# Patient Record
Sex: Female | Born: 1980
Health system: Southern US, Community
[De-identification: ages and names within clinical notes are randomized; demographics above are authoritative.]

## PROBLEM LIST (undated history)

## (undated) DIAGNOSIS — D259 Leiomyoma of uterus, unspecified: Secondary | ICD-10-CM

## (undated) DIAGNOSIS — Z9889 Other specified postprocedural states: Secondary | ICD-10-CM

## (undated) DIAGNOSIS — N979 Female infertility, unspecified: Secondary | ICD-10-CM

## (undated) DIAGNOSIS — E0590 Thyrotoxicosis, unspecified without thyrotoxic crisis or storm: Secondary | ICD-10-CM

## (undated) DIAGNOSIS — R112 Nausea with vomiting, unspecified: Secondary | ICD-10-CM

## (undated) DIAGNOSIS — I10 Essential (primary) hypertension: Secondary | ICD-10-CM

## (undated) DIAGNOSIS — IMO0001 Reserved for inherently not codable concepts without codable children: Secondary | ICD-10-CM

---
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
## 2011-05-03 ENCOUNTER — Other Ambulatory Visit: Payer: Self-pay | Admitting: Obstetrics and Gynecology

## 2011-05-03 DIAGNOSIS — E010 Iodine-deficiency related diffuse (endemic) goiter: Secondary | ICD-10-CM

## 2011-05-05 ENCOUNTER — Ambulatory Visit
Admission: RE | Admit: 2011-05-05 | Discharge: 2011-05-05 | Disposition: A | Discharge status: Home / Self Care | Payer: BC Managed Care – PPO | Source: Ambulatory Visit | Attending: Obstetrics and Gynecology | Admitting: Obstetrics and Gynecology

## 2011-05-05 DIAGNOSIS — E010 Iodine-deficiency related diffuse (endemic) goiter: Secondary | ICD-10-CM

## 2012-04-12 ENCOUNTER — Other Ambulatory Visit (HOSPITAL_COMMUNITY)
Admission: RE | Admit: 2012-04-12 | Discharge: 2012-04-12 | Disposition: A | Discharge status: Home / Self Care | Payer: 59 | Source: Ambulatory Visit | Attending: Obstetrics & Gynecology | Admitting: Obstetrics & Gynecology

## 2012-04-12 DIAGNOSIS — Z113 Encounter for screening for infections with a predominantly sexual mode of transmission: Secondary | ICD-10-CM | POA: Insufficient documentation

## 2012-04-12 DIAGNOSIS — Z124 Encounter for screening for malignant neoplasm of cervix: Secondary | ICD-10-CM | POA: Insufficient documentation

## 2012-04-12 DIAGNOSIS — Z1151 Encounter for screening for human papillomavirus (HPV): Secondary | ICD-10-CM | POA: Insufficient documentation

## 2012-05-03 ENCOUNTER — Ambulatory Visit: Payer: Self-pay | Admitting: Obstetrics and Gynecology

## 2012-09-25 HISTORY — PX: DIAGNOSTIC LAPAROSCOPY: SUR761

## 2012-12-26 ENCOUNTER — Ambulatory Visit (INDEPENDENT_AMBULATORY_CARE_PROVIDER_SITE_OTHER): Payer: 59 | Admitting: Women's Health

## 2012-12-26 ENCOUNTER — Encounter: Payer: Self-pay | Admitting: Women's Health

## 2012-12-26 VITALS — BP 136/92 | Ht 67.0 in | Wt 158.0 lb

## 2012-12-26 DIAGNOSIS — Z01419 Encounter for gynecological examination (general) (routine) without abnormal findings: Secondary | ICD-10-CM

## 2012-12-26 DIAGNOSIS — N979 Female infertility, unspecified: Secondary | ICD-10-CM

## 2012-12-26 DIAGNOSIS — N926 Irregular menstruation, unspecified: Secondary | ICD-10-CM

## 2012-12-26 DIAGNOSIS — Z833 Family history of diabetes mellitus: Secondary | ICD-10-CM

## 2012-12-26 LAB — CBC WITH DIFFERENTIAL/PLATELET
Basophils Absolute: 0 10*3/uL (ref 0.0–0.1)
Basophils Relative: 1 % (ref 0–1)
Eosinophils Absolute: 0.1 10*3/uL (ref 0.0–0.7)
Eosinophils Relative: 1 % (ref 0–5)
HCT: 40.9 % (ref 36.0–46.0)
Hemoglobin: 14 g/dL (ref 12.0–15.0)
Lymphocytes Relative: 39 % (ref 12–46)
Lymphs Abs: 1.7 10*3/uL (ref 0.7–4.0)
MCH: 32.6 pg (ref 26.0–34.0)
MCHC: 34.2 g/dL (ref 30.0–36.0)
MCV: 95.1 fL (ref 78.0–100.0)
Monocytes Absolute: 0.4 10*3/uL (ref 0.1–1.0)
Monocytes Relative: 8 % (ref 3–12)
Neutro Abs: 2.2 10*3/uL (ref 1.7–7.7)
Neutrophils Relative %: 51 % (ref 43–77)
Platelets: 251 10*3/uL (ref 150–400)
RBC: 4.3 MIL/uL (ref 3.87–5.11)
RDW: 13.9 % (ref 11.5–15.5)
WBC: 4.4 10*3/uL (ref 4.0–10.5)

## 2012-12-26 LAB — TSH: TSH: 0.549 u[IU]/mL (ref 0.350–4.500)

## 2012-12-26 LAB — GLUCOSE, RANDOM: Glucose, Bld: 94 mg/dL (ref 70–99)

## 2012-12-26 LAB — PROLACTIN: Prolactin: 10.5 ng/mL

## 2012-12-26 NOTE — Progress Notes (Signed)
[name] 07-20-1981 [id number]    History:    New patient presents for annual exam and problem. No contraception for 5 years without conception. States has positive ovulation with home ovulation predictor kits. History of positive Chlamydia 7 years ago treated. Cycles every 27-31 days for 5 d. Normal Pap history, last Pap June 2013. States husband is healthy. G2 TAB 1, SAB 1.  Past medical history, past surgical history, family history and social history were all reviewed and documented in the EPIC chart. 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 Graduated from [employer] in [place].  Numerous family members with hypertension.   ROS:  A  ROS was performed and pertinent positives and negatives are included in the history.  Exam:  Filed Vitals:   12/26/12 0807  BP: 136/92    General appearance:  Normal Head/Neck:  Normal, without cervical or supraclavicular adenopathy. Thyroid:  Symmetrical, normal in size, without palpable masses or nodularity. Respiratory  Effort:  Normal  Auscultation:  Clear without wheezing or rhonchi Cardiovascular  Auscultation:  Regular rate, without rubs, murmurs or gallops  Edema/varicosities:  Not grossly evident Abdominal  Soft,nontender, without masses, guarding or rebound.  Liver/spleen:  No organomegaly noted  Hernia:  None appreciated  Skin  Inspection:  Grossly normal  Palpation:  Grossly normal Neurologic/psychiatric  Orientation:  Normal with appropriate conversation.  Mood/affect:  Normal  Genitourinary    Breasts: Examined lying and sitting.     Right: Without masses, retractions, discharge or axillary adenopathy.     Left: Without masses, retractions, discharge or axillary adenopathy.   Inguinal/mons:  Normal without inguinal adenopathy  External genitalia:  Normal  BUS/Urethra/Skene's glands:  Normal  Bladder:  Normal  Vagina:  Normal  Cervix:  Normal  Uterus:  normal in size, shape and contour.  Midline and  mobile  Adnexa/parametria:     Rt: Without masses or tenderness.   Lt: Without masses or tenderness.  Anus and perineum: Normal  Digital rectal exam: Normal sphincter tone without palpated masses or tenderness  Assessment/Plan:  32 y.o. MBF G2P0 for annual exam.     Secondary infertility Borderline hypertension  Plan: Reviewed blood pressure reading today will keep a record and followup with primary care if continues greater than 130/80. SBE's, exercise, calcium rich diet, MVI daily encouraged. CBC, TSH, prolactin, UA, Pap normal 02/2012, new screening guidelines reviewed. Semen analysis, will schedule, estradiol, FSH day 3 of next cycle and progesterone day 22 -25. Hysterosalpingogram reviewed, need for antibiotic day before, day of, day after reviewed. If all tests are negative/normal will schedule appointment with Dr. Audie Box to discuss plan.    Harrington Challenger WHNP, 1:04 PM 12/26/2012

## 2012-12-26 NOTE — Patient Instructions (Signed)

## 2012-12-27 LAB — URINALYSIS W MICROSCOPIC + REFLEX CULTURE
Bilirubin Urine: NEGATIVE
Casts: NONE SEEN
Crystals: NONE SEEN
Glucose, UA: NEGATIVE mg/dL
Hgb urine dipstick: NEGATIVE
Ketones, ur: NEGATIVE mg/dL
Leukocytes, UA: NEGATIVE
Nitrite: NEGATIVE
Protein, ur: NEGATIVE mg/dL
Specific Gravity, Urine: 1.024 (ref 1.005–1.030)
Urobilinogen, UA: 0.2 mg/dL (ref 0.0–1.0)
pH: 5.5 (ref 5.0–8.0)

## 2012-12-30 LAB — URINE CULTURE: Colony Count: 15000

## 2013-01-15 ENCOUNTER — Other Ambulatory Visit: Payer: Self-pay | Admitting: *Deleted

## 2013-01-15 DIAGNOSIS — N979 Female infertility, unspecified: Secondary | ICD-10-CM

## 2013-01-16 ENCOUNTER — Other Ambulatory Visit: Payer: 59

## 2013-01-16 DIAGNOSIS — N979 Female infertility, unspecified: Secondary | ICD-10-CM

## 2013-01-16 LAB — ESTRADIOL: Estradiol: 34.1 pg/mL

## 2013-01-16 LAB — PROGESTERONE: Progesterone: 0.4 ng/mL

## 2013-01-16 LAB — FOLLICLE STIMULATING HORMONE: FSH: 5.2 m[IU]/mL

## 2013-01-21 ENCOUNTER — Other Ambulatory Visit: Payer: Self-pay | Admitting: Women's Health

## 2013-01-21 DIAGNOSIS — N979 Female infertility, unspecified: Secondary | ICD-10-CM

## 2013-02-05 ENCOUNTER — Other Ambulatory Visit: Payer: 59

## 2013-02-05 DIAGNOSIS — N979 Female infertility, unspecified: Secondary | ICD-10-CM

## 2013-02-05 LAB — PROGESTERONE: Progesterone: 5.6 ng/mL

## 2013-02-26 ENCOUNTER — Encounter: Payer: Self-pay | Admitting: Women's Health

## 2013-04-17 ENCOUNTER — Telehealth: Payer: Self-pay

## 2013-04-17 NOTE — Telephone Encounter (Signed)
Patient called and left message asking [place] to call her regarding "what we are working on" and is ready for "next step".  I called her back and reached her voice mail.  I left message that it looked like she had done everything [place] recommended at last ov with the exception of HSG.  I told her that is scheduled with cycle. To call Day one and it will be scheduled D 7, 8, 9, 10 at [facility].  I told her after that is completed she had recommended patient schedule appt with Dr. Velvet Bathe to discuss further plans.  I asked patient to call me back if any questions about this.

## 2013-05-02 ENCOUNTER — Telehealth: Payer: Self-pay | Admitting: *Deleted

## 2013-05-02 DIAGNOSIS — N979 Female infertility, unspecified: Secondary | ICD-10-CM

## 2013-05-02 MED ORDER — DOXYCYCLINE HYCLATE 100 MG PO CAPS: ORAL_CAPSULE | ORAL | Status: DC

## 2013-05-02 NOTE — Telephone Encounter (Signed)
Left message for to call to confirm this was 1st day of cycle.

## 2013-05-02 NOTE — Telephone Encounter (Signed)
[facility]

## 2013-05-02 NOTE — Telephone Encounter (Signed)
Pt informed with the below note, rx sent. 

## 2013-05-02 NOTE — Telephone Encounter (Signed)
Yes please, will need doxycycline 100 twice a day day before, day of a day after HSG  #6.

## 2013-05-02 NOTE — Telephone Encounter (Signed)
Pt called this am requesting appointment for HSG? Did you want me to schedule this for pt? Please advise

## 2013-05-08 ENCOUNTER — Ambulatory Visit (HOSPITAL_COMMUNITY)
Admission: RE | Admit: 2013-05-08 | Discharge: 2013-05-08 | Disposition: A | Discharge status: Home / Self Care | Payer: 59 | Source: Ambulatory Visit | Attending: Women's Health | Admitting: Women's Health

## 2013-05-08 DIAGNOSIS — N979 Female infertility, unspecified: Secondary | ICD-10-CM | POA: Insufficient documentation

## 2013-05-08 MED ORDER — IOHEXOL 300 MG/ML  SOLN
20.0000 mL | Freq: Once | INTRAMUSCULAR | Status: AC | PRN
  Administered 2013-05-08: 10 mL

## 2013-05-15 ENCOUNTER — Ambulatory Visit (INDEPENDENT_AMBULATORY_CARE_PROVIDER_SITE_OTHER): Payer: 59 | Admitting: Gynecology

## 2013-05-15 ENCOUNTER — Encounter: Payer: Self-pay | Admitting: Gynecology

## 2013-05-15 DIAGNOSIS — N7011 Chronic salpingitis: Secondary | ICD-10-CM

## 2013-05-15 DIAGNOSIS — N7013 Chronic salpingitis and oophoritis: Secondary | ICD-10-CM

## 2013-05-15 NOTE — Patient Instructions (Signed)
Followup with reproductive endocrinologist for consideration of in vitro fertilization.

## 2013-05-15 NOTE — Progress Notes (Signed)
Patient presents with history of secondary infertility. Had been attempting pregnancy for 5 years without success. Does have history of positive chlamydia 7 years ago. Baseline studies by Harriett Sine shows normal FSH/estradiol, progesterone of 5, semen analysis with 1% normal forms, 90% head defects, normal numbers and motility and an HSG that shows left hydrosalpinx and probable right hydrosalpinx with some question of spill but clear loculation of dye and no free intraperitoneal spill.  I reviewed all these findings with her and possibilities for fertility treatment. Possible laparoscopy with assessment of the pelvis and attempted salpingostomy versus in vitro fertilization discussed. Limitations of the laparoscopic/salpingostomy approach and success rates versus in vitro success discussed My recommendation would be to pursue in vitro fertilization which would address the tubal factor as well as the semen issue which shows a significant decrease of normal forms. Possible ICSI discussed. Names of various programs in the area were given. Patient's going to make her decision and followup as she decides.

## 2013-05-21 ENCOUNTER — Telehealth: Payer: Self-pay

## 2013-05-21 NOTE — Telephone Encounter (Signed)
She needs to contact IVF program in the area that she chooses. Options are [facility] and they have an office at [facility] where they see patients that they do their IVF work in [place]. [facility] and [facility] also have programs.

## 2013-05-21 NOTE — Telephone Encounter (Signed)
Patient informed. 

## 2013-05-21 NOTE — Telephone Encounter (Signed)
Patient said that she and husband had decision to make after last office visit and she wanted me to let you know they have decided they want to proceed with IVF treatment.   She said she needs someone to call her back with info. Sherrilyn Rist?

## 2013-07-02 ENCOUNTER — Telehealth: Payer: Self-pay | Admitting: *Deleted

## 2013-07-02 NOTE — Telephone Encounter (Signed)
Pt informed with the below. 

## 2013-07-02 NOTE — Telephone Encounter (Signed)
Please call and review we will check a Pap smear at annual exam on 10/15. It does not look like we have done a Pap since we have been on EMR

## 2013-07-02 NOTE — Telephone Encounter (Signed)
Pt said that her infertility MD will need a recent pap smear on pt? Pt has schedule appointment on 07/09/13. This okay?

## 2013-07-09 ENCOUNTER — Other Ambulatory Visit (HOSPITAL_COMMUNITY)
Admission: RE | Admit: 2013-07-09 | Discharge: 2013-07-09 | Disposition: A | Discharge status: Home / Self Care | Payer: 59 | Source: Ambulatory Visit | Attending: Gynecology | Admitting: Gynecology

## 2013-07-09 ENCOUNTER — Encounter: Payer: Self-pay | Admitting: Women's Health

## 2013-07-09 ENCOUNTER — Ambulatory Visit (INDEPENDENT_AMBULATORY_CARE_PROVIDER_SITE_OTHER): Payer: 59 | Admitting: Women's Health

## 2013-07-09 DIAGNOSIS — Z01419 Encounter for gynecological examination (general) (routine) without abnormal findings: Secondary | ICD-10-CM | POA: Insufficient documentation

## 2013-07-09 DIAGNOSIS — Z124 Encounter for screening for malignant neoplasm of cervix: Secondary | ICD-10-CM

## 2013-07-09 NOTE — Progress Notes (Signed)
Patient ID: [name], female   DOB: 1980/11/09, 32 y.o.   MRN: [id number] Presents for a Pap. Being evaluated and treated by Dr. Elesa Hacker for infertility. Bilateral blocked fallopian tubes and low sperm count, IVF goal.  Exam: Appears well, external genitalia within normal limits, speculum exam cervix healthy without visible lesion Pap taken.  Instructed to printt Pap results and take to next appointment with Dr. Elesa Hacker.

## 2013-07-31 ENCOUNTER — Other Ambulatory Visit: Payer: Self-pay

## 2014-05-30 ENCOUNTER — Inpatient Hospital Stay (HOSPITAL_COMMUNITY): Admission: AD | Admit: 2014-05-30 | Payer: 59 | Source: Ambulatory Visit | Admitting: Obstetrics and Gynecology

## 2014-07-06 ENCOUNTER — Encounter (HOSPITAL_BASED_OUTPATIENT_CLINIC_OR_DEPARTMENT_OTHER): Payer: Self-pay | Admitting: *Deleted

## 2014-07-06 NOTE — Progress Notes (Signed)
To [facility] at 1030 Hg,urine pregnancy on arrival-orders pending-Npo after Mn.

## 2014-07-07 NOTE — Anesthesia Preprocedure Evaluation (Addendum)
Anesthesia Evaluation  Patient identified by MRN, date of birth, ID band Patient awake    Reviewed: Allergy & Precautions, H&P , NPO status , Patient's Chart, lab work & pertinent test results  History of Anesthesia Complications Negative for: history of anesthetic complications  Airway Mallampati: II TM Distance: >3 FB Neck ROM: Full    Dental no notable dental hx. (+) Teeth Intact, Dental Advisory Given   Pulmonary neg pulmonary ROS,  breath sounds clear to auscultation  Pulmonary exam normal       Cardiovascular Exercise Tolerance: Good negative cardio ROS  Rhythm:Regular Rate:Normal     Neuro/Psych negative neurological ROS  negative psych ROS   GI/Hepatic negative GI ROS, Neg liver ROS,   Endo/Other  negative endocrine ROS  Renal/GU negative Renal ROS  negative genitourinary   Musculoskeletal negative musculoskeletal ROS (+)   Abdominal   Peds negative pediatric ROS (+)  Hematology negative hematology ROS (+)   Anesthesia Other Findings   Reproductive/Obstetrics negative OB ROS                          Anesthesia Physical Anesthesia Plan  ASA: I  Anesthesia Plan: General   Post-op Pain Management:    Induction: Intravenous  Airway Management Planned: Oral ETT  Additional Equipment:   Intra-op Plan:   Post-operative Plan: Extubation in OR  Informed Consent: I have reviewed the patients History and Physical, chart, labs and discussed the procedure including the risks, benefits and alternatives for the proposed anesthesia with the patient or authorized representative who has indicated his/her understanding and acceptance.   Dental advisory given  Plan Discussed with: CRNA  Anesthesia Plan Comments:         Anesthesia Quick Evaluation

## 2014-07-08 ENCOUNTER — Ambulatory Visit (HOSPITAL_BASED_OUTPATIENT_CLINIC_OR_DEPARTMENT_OTHER)
Admission: RE | Admit: 2014-07-08 | Discharge: 2014-07-08 | Disposition: A | Discharge status: Home / Self Care | Payer: 59 | Source: Ambulatory Visit | Attending: Obstetrics and Gynecology | Admitting: Obstetrics and Gynecology

## 2014-07-08 ENCOUNTER — Encounter (HOSPITAL_BASED_OUTPATIENT_CLINIC_OR_DEPARTMENT_OTHER)
Admission: RE | Disposition: A | Discharge status: Home / Self Care | Payer: Self-pay | Source: Ambulatory Visit | Attending: Obstetrics and Gynecology

## 2014-07-08 ENCOUNTER — Encounter (HOSPITAL_BASED_OUTPATIENT_CLINIC_OR_DEPARTMENT_OTHER): Payer: 59 | Admitting: Anesthesiology

## 2014-07-08 ENCOUNTER — Encounter (HOSPITAL_BASED_OUTPATIENT_CLINIC_OR_DEPARTMENT_OTHER): Payer: Self-pay | Admitting: Anesthesiology

## 2014-07-08 ENCOUNTER — Ambulatory Visit (HOSPITAL_BASED_OUTPATIENT_CLINIC_OR_DEPARTMENT_OTHER): Payer: 59 | Admitting: Anesthesiology

## 2014-07-08 DIAGNOSIS — Z87898 Personal history of other specified conditions: Secondary | ICD-10-CM | POA: Insufficient documentation

## 2014-07-08 DIAGNOSIS — N838 Other noninflammatory disorders of ovary, fallopian tube and broad ligament: Secondary | ICD-10-CM | POA: Insufficient documentation

## 2014-07-08 DIAGNOSIS — N7011 Chronic salpingitis: Secondary | ICD-10-CM | POA: Diagnosis present

## 2014-07-08 HISTORY — PX: LAPAROSCOPY: SHX197

## 2014-07-08 LAB — POCT PREGNANCY, URINE: Preg Test, Ur: NEGATIVE

## 2014-07-08 LAB — POCT HEMOGLOBIN-HEMACUE: Hemoglobin: 13.9 g/dL (ref 12.0–15.0)

## 2014-07-08 SURGERY — LAPAROSCOPY OPERATIVE
Anesthesia: General | Site: Abdomen | Laterality: Bilateral

## 2014-07-08 MED ORDER — NEOSTIGMINE METHYLSULFATE 10 MG/10ML IV SOLN
INTRAVENOUS | Status: DC | PRN
  Administered 2014-07-08: 3 mg via INTRAVENOUS

## 2014-07-08 MED ORDER — ACETAMINOPHEN 10 MG/ML IV SOLN
INTRAVENOUS | Status: DC | PRN
  Administered 2014-07-08: 1000 mg via INTRAVENOUS

## 2014-07-08 MED ORDER — GLYCOPYRROLATE 0.2 MG/ML IJ SOLN
INTRAMUSCULAR | Status: DC | PRN
  Administered 2014-07-08: 0.4 mg via INTRAVENOUS

## 2014-07-08 MED ORDER — CEFAZOLIN SODIUM-DEXTROSE 2-3 GM-% IV SOLR
INTRAVENOUS | Status: AC
  Filled 2014-07-08: qty 50

## 2014-07-08 MED ORDER — ROCURONIUM BROMIDE 100 MG/10ML IV SOLN
INTRAVENOUS | Status: DC | PRN
  Administered 2014-07-08: 10 mg via INTRAVENOUS
  Administered 2014-07-08: 25 mg via INTRAVENOUS

## 2014-07-08 MED ORDER — BUPIVACAINE-EPINEPHRINE 0.25% -1:200000 IJ SOLN
INTRAMUSCULAR | Status: DC | PRN
  Administered 2014-07-08: 6 mL

## 2014-07-08 MED ORDER — SODIUM CHLORIDE 0.9 % IR SOLN
Status: DC | PRN
  Administered 2014-07-08: 500 mL

## 2014-07-08 MED ORDER — LACTATED RINGERS IV SOLN
INTRAVENOUS | Status: DC
  Administered 2014-07-08 (×3): via INTRAVENOUS
  Filled 2014-07-08: qty 1000

## 2014-07-08 MED ORDER — METHYLENE BLUE 1 % INJ SOLN
INTRAMUSCULAR | Status: DC | PRN
  Administered 2014-07-08: 1 mL

## 2014-07-08 MED ORDER — LIDOCAINE HCL (CARDIAC) 20 MG/ML IV SOLN
INTRAVENOUS | Status: DC | PRN
  Administered 2014-07-08: 100 mg via INTRAVENOUS

## 2014-07-08 MED ORDER — FENTANYL CITRATE 0.05 MG/ML IJ SOLN
INTRAMUSCULAR | Status: AC
  Filled 2014-07-08: qty 4

## 2014-07-08 MED ORDER — FENTANYL CITRATE 0.05 MG/ML IJ SOLN
INTRAMUSCULAR | Status: DC | PRN
  Administered 2014-07-08: 25 ug via INTRAVENOUS
  Administered 2014-07-08: 100 ug via INTRAVENOUS
  Administered 2014-07-08: 50 ug via INTRAVENOUS

## 2014-07-08 MED ORDER — PROPOFOL INFUSION 10 MG/ML OPTIME
INTRAVENOUS | Status: DC | PRN
  Administered 2014-07-08: 100 mL via INTRAVENOUS
  Administered 2014-07-08: 200 mL via INTRAVENOUS

## 2014-07-08 MED ORDER — ONDANSETRON HCL 4 MG/2ML IJ SOLN
INTRAMUSCULAR | Status: DC | PRN
  Administered 2014-07-08: 4 mg via INTRAVENOUS

## 2014-07-08 MED ORDER — MIDAZOLAM HCL 2 MG/2ML IJ SOLN
INTRAMUSCULAR | Status: AC
  Filled 2014-07-08: qty 2

## 2014-07-08 MED ORDER — CEFAZOLIN SODIUM-DEXTROSE 2-3 GM-% IV SOLR
2.0000 g | INTRAVENOUS | Status: AC
  Administered 2014-07-08: 2 g via INTRAVENOUS
  Filled 2014-07-08: qty 50

## 2014-07-08 MED ORDER — PROMETHAZINE HCL 25 MG/ML IJ SOLN
6.2500 mg | INTRAMUSCULAR | Status: DC | PRN
  Filled 2014-07-08: qty 1

## 2014-07-08 MED ORDER — MIDAZOLAM HCL 5 MG/5ML IJ SOLN
INTRAMUSCULAR | Status: DC | PRN
  Administered 2014-07-08: 2 mg via INTRAVENOUS

## 2014-07-08 MED ORDER — DOXYCYCLINE HYCLATE 50 MG PO CAPS: 100.0000 mg | ORAL_CAPSULE | Freq: Two times a day (BID) | ORAL | Status: DC

## 2014-07-08 MED ORDER — DEXAMETHASONE SODIUM PHOSPHATE 10 MG/ML IJ SOLN
INTRAMUSCULAR | Status: DC | PRN
  Administered 2014-07-08: 10 mg via INTRAVENOUS

## 2014-07-08 MED ORDER — LACTATED RINGERS IR SOLN
Status: DC | PRN
  Administered 2014-07-08: 1000 mL

## 2014-07-08 MED ORDER — NAFARELIN ACETATE 2 MG/ML NA SOLN
NASAL | Status: DC
Start: 2014-07-08 — End: 2015-05-21

## 2014-07-08 MED ORDER — FENTANYL CITRATE 0.05 MG/ML IJ SOLN
25.0000 ug | INTRAMUSCULAR | Status: DC | PRN
  Filled 2014-07-08: qty 1

## 2014-07-08 SURGICAL SUPPLY — 68 items
BAG URINE DRAINAGE (UROLOGICAL SUPPLIES) ×2 IMPLANT
BARRIER ADHS 3X4 INTERCEED (GAUZE/BANDAGES/DRESSINGS) ×2 IMPLANT
BLADE SURG 11 STRL SS (BLADE) ×2 IMPLANT
BLADE SURG 15 STRL LF DISP TIS (BLADE) ×1 IMPLANT
BLADE SURG 15 STRL SS (BLADE) ×1
CATH FOLEY 2WAY SLVR  5CC 14FR (CATHETERS) ×1
CATH FOLEY 2WAY SLVR 5CC 14FR (CATHETERS) ×1 IMPLANT
CATH ROBINSON RED A/P 16FR (CATHETERS) IMPLANT
COVER MAYO STAND STRL (DRAPES) ×2 IMPLANT
DERMABOND ADVANCED (GAUZE/BANDAGES/DRESSINGS)
DERMABOND ADVANCED .7 DNX12 (GAUZE/BANDAGES/DRESSINGS) IMPLANT
DEVICE TROCAR PUNCTURE CLOSURE (ENDOMECHANICALS) IMPLANT
DRAPE CAMERA CLOSED 9X96 (DRAPES) ×2 IMPLANT
DRAPE UNDERBUTTOCKS STRL (DRAPE) ×2 IMPLANT
DRSG OPSITE POSTOP 3X4 (GAUZE/BANDAGES/DRESSINGS) IMPLANT
DRSG TELFA 3X8 NADH (GAUZE/BANDAGES/DRESSINGS) ×2 IMPLANT
ELECT NEEDLE TIP 2.8 STRL (NEEDLE) IMPLANT
ELECT REM PT RETURN 9FT ADLT (ELECTROSURGICAL) ×2
ELECTRODE REM PT RTRN 9FT ADLT (ELECTROSURGICAL) ×1 IMPLANT
EVACUATOR SMOKE 8.L (FILTER) ×2 IMPLANT
GLOVE BIO SURGEON STRL SZ8 (GLOVE) ×2 IMPLANT
GLOVE BIOGEL M 6.5 STRL (GLOVE) ×4 IMPLANT
GLOVE BIOGEL PI IND STRL 8.5 (GLOVE) ×1 IMPLANT
GLOVE BIOGEL PI INDICATOR 8.5 (GLOVE) ×1
GLOVE INDICATOR 6.5 STRL GRN (GLOVE) ×6 IMPLANT
GOWN PREVENTION PLUS LG XLONG (DISPOSABLE) ×4 IMPLANT
GOWN STRL REUS W/ TWL XL LVL3 (GOWN DISPOSABLE) ×1 IMPLANT
GOWN STRL REUS W/TWL XL LVL3 (GOWN DISPOSABLE) ×1
HOLDER FOLEY CATH W/STRAP (MISCELLANEOUS) ×2 IMPLANT
MANIPULATOR UTERINE 4.5 ZUMI (MISCELLANEOUS) ×2 IMPLANT
NEEDLE HYPO 25X1 1.5 SAFETY (NEEDLE) ×2 IMPLANT
NEEDLE INSUFFLATION 14GA 120MM (NEEDLE) ×2 IMPLANT
NS IRRIG 500ML POUR BTL (IV SOLUTION) ×2 IMPLANT
PACK BASIN DAY SURGERY FS (CUSTOM PROCEDURE TRAY) ×2 IMPLANT
PACK LAPAROSCOPY II (CUSTOM PROCEDURE TRAY) ×2 IMPLANT
PAD OB MATERNITY 4.3X12.25 (PERSONAL CARE ITEMS) ×2 IMPLANT
PENCIL BUTTON HOLSTER BLD 10FT (ELECTRODE) IMPLANT
POUCH SPECIMEN RETRIEVAL 10MM (ENDOMECHANICALS) IMPLANT
SCALPEL HARMONIC ACE (MISCELLANEOUS) ×2 IMPLANT
SEALER TISSUE G2 CVD JAW 35 (ENDOMECHANICALS) IMPLANT
SEALER TISSUE G2 CVD JAW 45CM (ENDOMECHANICALS) IMPLANT
SEPRAFILM MEMBRANE 5X6 (MISCELLANEOUS) IMPLANT
SET IRRIG TUBING LAPAROSCOPIC (IRRIGATION / IRRIGATOR) ×2 IMPLANT
SOLUTION ANTI FOG 6CC (MISCELLANEOUS) ×2 IMPLANT
STRIP CLOSURE SKIN 1/2X4 (GAUZE/BANDAGES/DRESSINGS) ×2 IMPLANT
SUT MNCRL AB 4-0 PS2 18 (SUTURE) ×2 IMPLANT
SUT PROLENE 0 CT 1 30 (SUTURE) IMPLANT
SUT PROLENE 5 0 P 3 (SUTURE) ×2 IMPLANT
SUT VIC AB 2-0 CT1 27 (SUTURE)
SUT VIC AB 2-0 CT1 TAPERPNT 27 (SUTURE) IMPLANT
SUT VIC AB 2-0 PS2 27 (SUTURE) ×2 IMPLANT
SUT VIC AB 2-0 UR6 27 (SUTURE) IMPLANT
SUT VICRYL 0 TIES 12 18 (SUTURE) IMPLANT
SYR 20CC LL (SYRINGE) ×2 IMPLANT
SYR 3ML 23GX1 SAFETY (SYRINGE) ×2 IMPLANT
SYR 50ML LL SCALE MARK (SYRINGE) IMPLANT
SYR 5ML LL (SYRINGE) ×2 IMPLANT
SYR CONTROL 10ML LL (SYRINGE) ×2 IMPLANT
SYRINGE 12CC LL (MISCELLANEOUS) ×2 IMPLANT
SYS LAPSCP GELPORT 120MM (MISCELLANEOUS)
SYSTEM LAPSCP GELPORT 120MM (MISCELLANEOUS) IMPLANT
TOWEL OR 17X24 6PK STRL BLUE (TOWEL DISPOSABLE) ×4 IMPLANT
TRAY DSU PREP LF (CUSTOM PROCEDURE TRAY) ×2 IMPLANT
TROCAR OPTI TIP 5M 100M (ENDOMECHANICALS) ×6 IMPLANT
TROCAR XCEL DIL TIP R 11M (ENDOMECHANICALS) IMPLANT
TUBING INSUFFLATION 10FT LAP (TUBING) ×2 IMPLANT
WARMER LAPAROSCOPE (MISCELLANEOUS) ×2 IMPLANT
WATER STERILE IRR 500ML POUR (IV SOLUTION) IMPLANT

## 2014-07-08 NOTE — Discharge Instructions (Signed)
HOME CARE INSTRUCTIONS - LAPAROSCOPY ° °Wound Care: The bandaids or dressing which are placed over the skin openings may be removed the day after surgery. The incision should be kept clean and dry. The stitches do not need to be removed. Should the incision become sore, red, and swollen after the first week, check with your doctor. ° °Personal Hygiene: Shower the day after your procedure. Always wipe from front to back after elimination.  ° °Activity: Do not drive or operate any equipment today. The effects of the anesthesia are still present and drowsiness may result. Rest today, not necessarily flat bed rest, just take it easy. You may resume your normal activity in one to three days or as instructed by your physician. ° °Sexual Activity: You resume sexual activity as indicated by your physician_________. °If your laparoscopy was for a sterilization ( tubes tied ), continue current method of birth control until after your next period or ask for specific instructions from your doctor. ° °Diet: Eat a light diet as desired this evening. You may resume a regular diet tomorrow. ° °Return to Work: Two to three days or as indicated by your doctor. ° °Expectations °After Surgery: Your surgery will cause vaginal drainage or spotting which may continue for 2-3 days. Mild abdominal discomfort or tenderness is not unusual and some shoulder pain may also be noted which can be relieved by lying flat in pain. ° °Call Your Doctor °If these Occur:  Persistent or heavy bleeding at incision site °      Redness or swelling around incision °      Elevation of temperature greater than 100 degrees F ° °Call for follow-up appointment  ° ° °Post Anesthesia Home Care Instructions ° °Activity: °Get plenty of rest for the remainder of the day. A responsible adult should stay with you for 24 hours following the procedure.  °For the next 24 hours, DO NOT: °-Drive a car °-Operate machinery °-Drink alcoholic beverages °-Take any medication  unless instructed by your physician °-Make any legal decisions or sign important papers. ° °Meals: °Start with liquid foods such as gelatin or soup. Progress to regular foods as tolerated. Avoid greasy, spicy, heavy foods. If nausea and/or vomiting occur, drink only clear liquids until the nausea and/or vomiting subsides. Call your physician if vomiting continues. ° °Special Instructions/Symptoms: °Your throat may feel dry or sore from the anesthesia or the breathing tube placed in your throat during surgery. If this causes discomfort, gargle with warm salt water. The discomfort should disappear within 24 hours. ° °

## 2014-07-08 NOTE — H&P (Addendum)
[name] is a 33 y.o. female , originally referred to me by Dr. Garwin Brothers, Patient was diagnosed with bilateral hydrosalpinx by HSG on May 08, 2013. Patient had IVF elsewhere which eventually resulted in spontaneous abortion with an empty sac. As studies have shown deleterious effects of hydrosalpinx on embryo implantation and on miscarriage, I recommended a laparoscopy and removal of both hydrosalpinges or repair of the better one of the damaged tubes while removing the worse tube.  Patient agrees.  Pertinent Gynecological History: Menses: flow is regular every 28-30 days Bleeding: moderate Contraception: none DES exposure: denies Blood transfusions: none Sexually transmitted diseases: previous chlamydia infection in 2007. Previous GYN Procedures: previous TVOR in 2014. Last mammogram: never done. Last pap: normal  OB History: Gravida 1, Biochemical 1   Menstrual History: Menarche age: 76 No LMP recorded.    Past Medical History  Diagnosis Date  . H/O infertility                     Past Surgical History  Procedure Laterality Date  . Foot surgery Right 1997    achillis reconstruction  . Diagnostic laparoscopy  2014    egg retrieval             Family History  Problem Relation Age of Onset  . Asthma Mother   . Hypertension Father   . Asthma Brother   . Cancer Paternal Uncle     lymphoma  . Diabetes Maternal Grandfather    No hereditary disease.  No cancer of breast, ovary, uterus. No cutaneous leiomyomatosis or renal cell carcinoma.  History   Social History  . Marital Status: Married    Spouse Name: N/A    Number of Children: N/A  . Years of Education: N/A   Occupational History  . Not on file.   Social History Main Topics  . Smoking status: Never Smoker   . Smokeless tobacco: Never Used  . Alcohol Use: Yes     Comment: RARE  . Drug Use: No  . Sexual Activity: Yes   Other Topics Concern  . Not on file   Social History Narrative  . No  narrative on file    No Known Allergies  No current facility-administered medications on file prior to encounter.   Current Outpatient Prescriptions on File Prior to Encounter  Medication Sig Dispense Refill  . Nafarelin Acetate (SYNAREL) 2 MG/ML SOLN Place into the nose.         Review of Systems  Constitutional: Negative.   HENT: Negative.   Eyes: Negative.   Respiratory: Negative.   Cardiovascular: Negative.   Gastrointestinal: Negative.   Genitourinary: Negative.   Musculoskeletal: Negative.   Skin: Negative.   Neurological: Negative.   Endo/Heme/Allergies: Negative.   Psychiatric/Behavioral: Negative.      Physical Exam  Ht 5\' 7"  (1.702 m)  Wt 74.844 kg (165 lb)  BMI 25.84 kg/m2  LMP 06/23/2014 Constitutional: She is oriented to person, place, and time. She appears well-developed and well-nourished.  HENT:  Head: Normocephalic and atraumatic.  Nose: Nose normal.  Mouth/Throat: Oropharynx is clear and moist. No oropharyngeal exudate.  Eyes: Conjunctivae normal and EOM are normal. Pupils are equal, round, and reactive to light. No scleral icterus.  Neck: Normal range of motion. Neck supple. No tracheal deviation present. No thyromegaly present.  Cardiovascular: Normal rate.   Respiratory: Effort normal and breath sounds normal.  GI: Soft. Bowel sounds are normal. She exhibits no distension and no mass. There  is no tenderness.  Lymphadenopathy:    She has no cervical adenopathy.  Neurological: She is alert and oriented to person, place, and time. She has normal reflexes.  Skin: Skin is warm.  Psychiatric: She has a normal mood and affect. Her behavior is normal. Judgment and thought content normal.    Assessment/Plan:  We will perform a laparoscopy and either bilateral salpingectomy, or unilateral salpingectomy with salpingoneostomy of the better tube. She will follow-up for a pre-op visit at our office in two weeks. She will begin an IVF/ICSI cycle.     Governor Specking

## 2014-07-08 NOTE — Op Note (Signed)
Operative Note  Preoperative diagnosis: Bilateral hydrosalpinx, infertility, pelvic pain  Postoperative diagnosis: Bilateral hydrosalpinx, infertility, pelvic pain  Procedure: Laparoscopy, lysis of adhesions, left salpingoneostomy, right salpingectomy, chromopertubation, endometrial biopsy  Anesthesia: Gen. endotracheal  Complications: None  Estimated blood loss: <10 cc  Specimens:   Findings: On laparoscopy, there were perihepatic adhesions under right lobe of the liver to the diaphragm. The left lobe of the liver appeared normal. Gallbladder was normal. Appendix was not visualized.  The uterus sounded to 9 cm and had a rounded posterior surface, suggestive of an intramural myoma and a 1 x 1 cm left fundal pedunculated myoma.  The left tube showed hydrosalpinx with normal proximal portion. The hydrosalpinx was 2 cm wide and did not have any peritubal adhesions. Upon salpingo-need ostomy the mucosal folds were 30% preserved. Chromotubation did not result in filling of either tube but I consider this a technical problem since patient's previous HSG showed opacification of both tubes. The left ovary had a filmy adhesion to the pelvic sidewall which was lysed. The right tube was grossly normal. The right fallopian tube also showed a 2-2.5 cm hydrosalpinx with normal proximal portion of the tube. This tube was more convoluted even though it did not have peritubal adhesions. Therefore I decided to remove this tube. There were no peritoneal lesions of endometriosis.  Description of the procedure: The patient was placed in dorsal supine position and general endotracheal anesthesia was given. 2 g of cefazolin were given intravenously for prophylaxis. Patient was placed in lithotomy position. She was prepped and draped inside manner.  The bladder was straight catheterized. An endometrial biopsy was performed with a Kevorkian curet, after the uterus was sounded to 9 cm. A ZUMI catheter was placed into the  uterine cavity. The uterus sounded to 9 cm. The surgeon was regloved and a surgical field was created on the abdomen.  After preemptive anesthesia of all surgical sites with 0.25% bupivacaine, a 5 mm intraumbilical skin incision was made and a Verress needle was inserted. Its correct location was confirmed. A pneumoperitoneum was created with carbon dioxide.  5 mm laparoscope with a 30 lens was inserted and video laparoscopy was started . A left lower quadrant 5 mm and a right lower quadrant  5 mm incisions were made and ancillary trochars were placed under direct visualization. Above findings were noted.   Using a needle electrode on 81 W of cutting current, a generous stellate incision was scored on the serosa and superficial muscularis on the tip of the left hydrosalpinx. Using scissors the tubal lumen was entered and hydrosalpinx was drained and a stellate incision was completed sharply. Next I placed interrupted sutures of 5-0 Prolene from the serosa of the distal end of the salpingostomy site to the serosa of the proximal ampulla of the tube, everting each of the 4 flaps. At the end of the salpingostomy procedure, the left tube showed thin walls with 30% preservation of the mucosal folds and free of peritubal adhesions. The right salpingectomy was performed by transecting the uterotubal junction with harmonic ACE and then using the needle electrode in cutting and, when needed, coagulating modes to separate the tube from its mesosalpinx all the way to the infundibular end of the tube. At this point harmonic ACE coagulation and cutting was performed again, removing the tube. The tube was grasped with a Kelley clamp and taken out of the abdomen and sent to pathology. Good hemostasis was confirmed. The pelvis was copiously irrigated and aspirated. The gas  was allowed to come out. Trochars were removed. Instrument and lap pad count were correct. 4-0 Monocryl sutures were applied in subcuticular stitches to  approximate the skin.  The patient tolerated the procedure well and was transferred to recovery room in satisfactory condition.

## 2014-07-08 NOTE — Transfer of Care (Signed)
Immediate Anesthesia Transfer of Care Note  Patient: [name]  Procedure(s) Performed: Procedure(s): LAPAROSCOPY OPERATIVE,RIGHT SALPINGECTOMY AND LEFT SALPINGONEOSTOMY  (Bilateral)  Patient Location: PACU  Anesthesia Type:General  Level of Consciousness: awake, alert , oriented and patient cooperative  Airway & Oxygen Therapy: Patient Spontanous Breathing and Patient connected to nasal cannula oxygen  Post-op Assessment: Report given to PACU RN and Post -op Vital signs reviewed and stable  Post vital signs: Reviewed and stable  Complications: No apparent anesthesia complications

## 2014-07-08 NOTE — Anesthesia Procedure Notes (Signed)
Procedure Name: Intubation Date/Time: 07/08/2014 12:12 PM Performed by: Wanita Chamberlain Pre-anesthesia Checklist: Patient identified, Emergency Drugs available, Suction available, Patient being monitored and Timeout performed Patient Re-evaluated:Patient Re-evaluated prior to inductionOxygen Delivery Method: Circle system utilized Preoxygenation: Pre-oxygenation with 100% oxygen Intubation Type: IV induction Ventilation: Mask ventilation without difficulty Laryngoscope Size: Mac and 3 Grade View: Grade I Tube type: Oral Tube size: 7.0 mm Number of attempts: 1 Airway Equipment and Method: Stylet Placement Confirmation: ETT inserted through vocal cords under direct vision,  positive ETCO2 and breath sounds checked- equal and bilateral Secured at: 21 cm Tube secured with: Tape Dental Injury: Teeth and Oropharynx as per pre-operative assessment

## 2014-07-09 ENCOUNTER — Encounter (HOSPITAL_BASED_OUTPATIENT_CLINIC_OR_DEPARTMENT_OTHER): Payer: Self-pay | Admitting: Obstetrics and Gynecology

## 2014-07-10 NOTE — Anesthesia Postprocedure Evaluation (Signed)
  Anesthesia Post-op Note  Patient: [name]  Procedure(s) Performed: Procedure(s) (LRB): LAPAROSCOPY OPERATIVE,RIGHT SALPINGECTOMY AND LEFT SALPINGONEOSTOMY  (Bilateral)  Patient Location: PACU  Anesthesia Type: General  Level of Consciousness: awake and alert   Airway and Oxygen Therapy: Patient Spontanous Breathing  Post-op Pain: mild  Post-op Assessment: Post-op Vital signs reviewed, Patient's Cardiovascular Status Stable, Respiratory Function Stable, Patent Airway and No signs of Nausea or vomiting  Last Vitals:  Filed Vitals:   07/08/14 1745  BP: 130/85  Pulse: 77  Temp: 36.6 C  Resp: 16    Post-op Vital Signs: stable   Complications: No apparent anesthesia complications

## 2014-07-27 ENCOUNTER — Encounter (HOSPITAL_BASED_OUTPATIENT_CLINIC_OR_DEPARTMENT_OTHER): Payer: Self-pay | Admitting: Obstetrics and Gynecology

## 2014-11-25 IMAGING — RF DG HYSTEROGRAM
9 series · 9 of 9 positions shown · IV contrast (omnipaque)
Comparison: none

CLINICAL DATA: Infertility.  Assess tubal patency

HYSTEROSALPINGOGRAM
TECHNIQUE: Following cleansing of the cervix and vagina with
Betadine solution, a hysterosalpingogram was performed using a 5-
French hysterosalpingogram catheter and Omnipaque 300 contrast.
The patient tolerated the examination without difficulty.
Fluoroscopy time: 1 minute 0 seconds

[Series 1: run · 1 of 1 slices shown (1 of 9)]
[im 1/1]
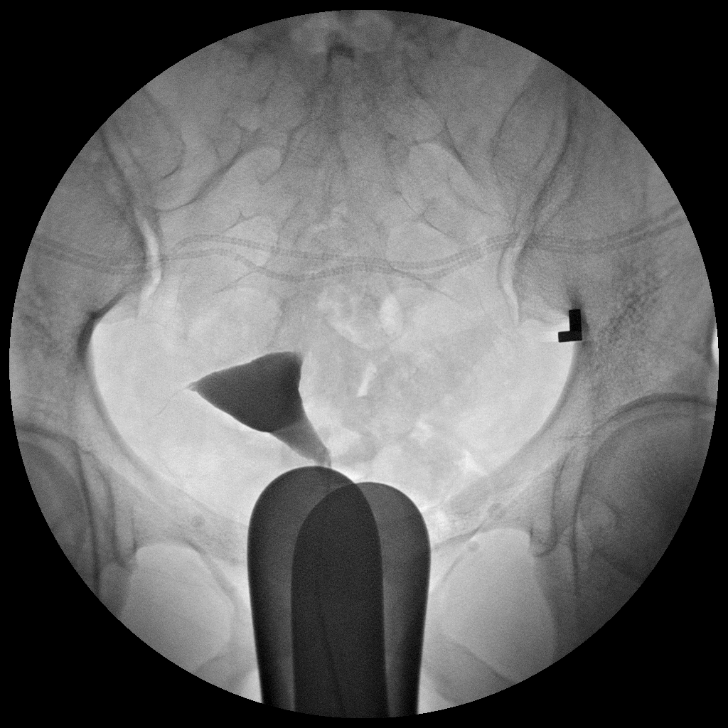

[Series 2: run · 1 of 1 slices shown (2 of 9)]
[im 1/1]
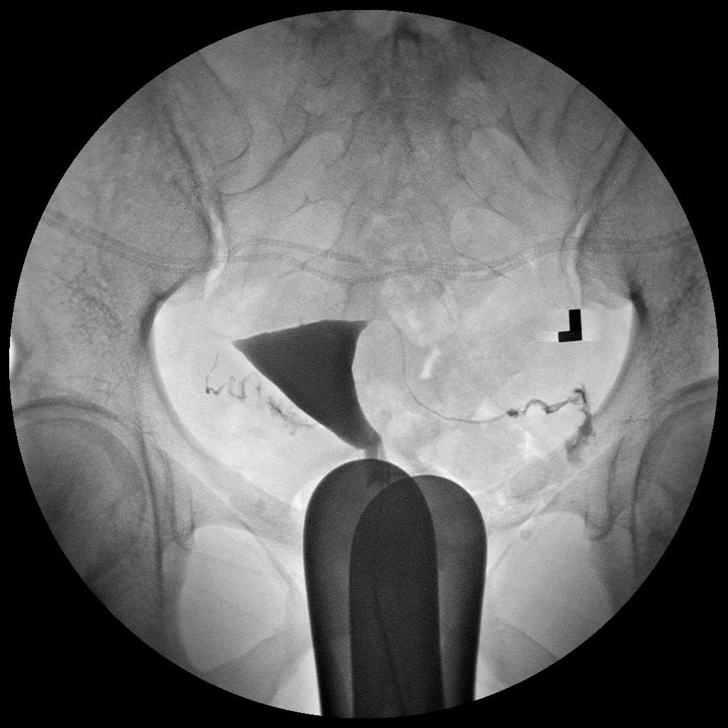

[Series 3: run · 1 of 1 slices shown (3 of 9)]
[im 1/1]
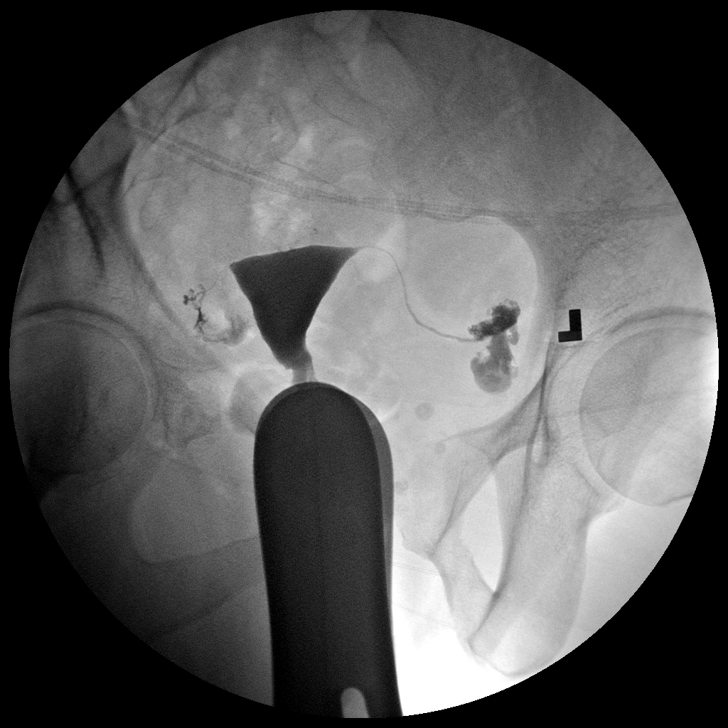

[Series 4: run · 1 of 1 slices shown (4 of 9)]
[im 1/1]
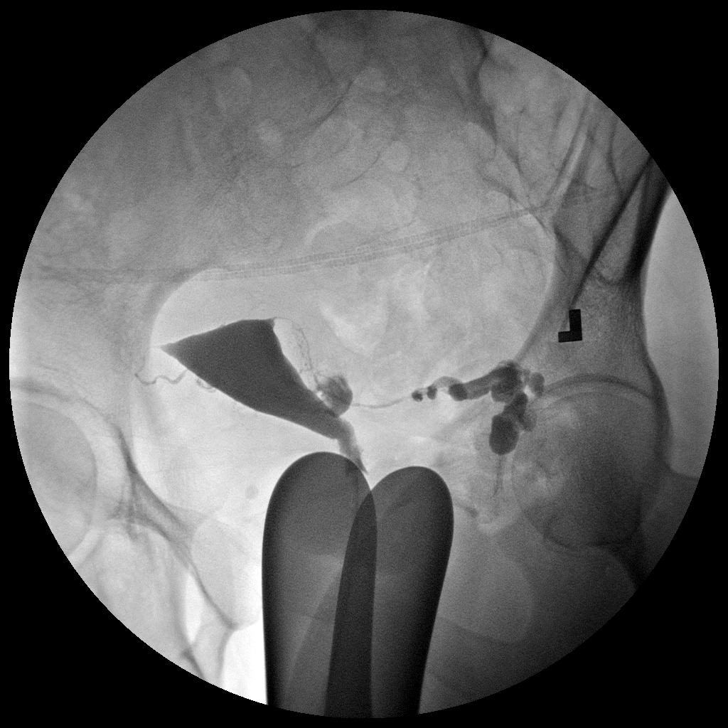

[Series 5: run · 1 of 1 slices shown (5 of 9)]
[im 1/1]
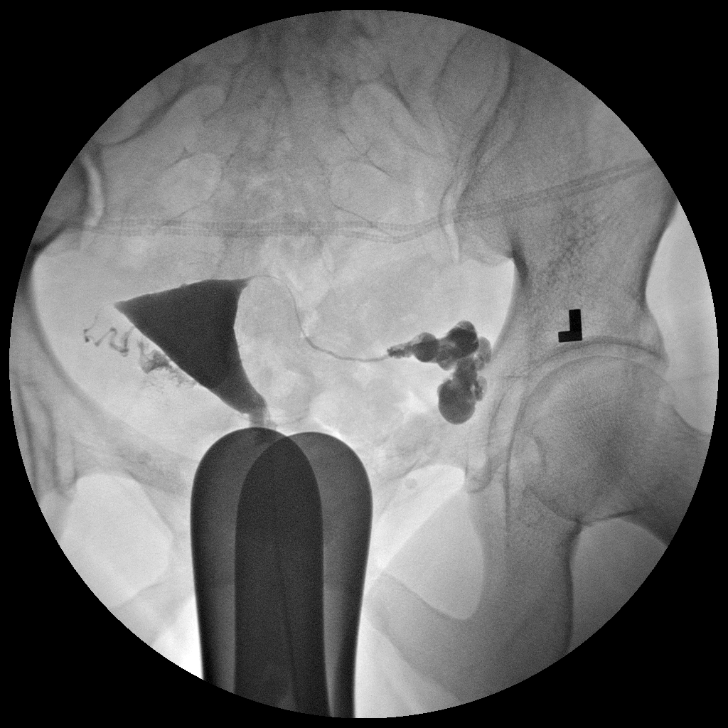

[Series 6: run · 1 of 1 slices shown (6 of 9)]
[im 1/1]
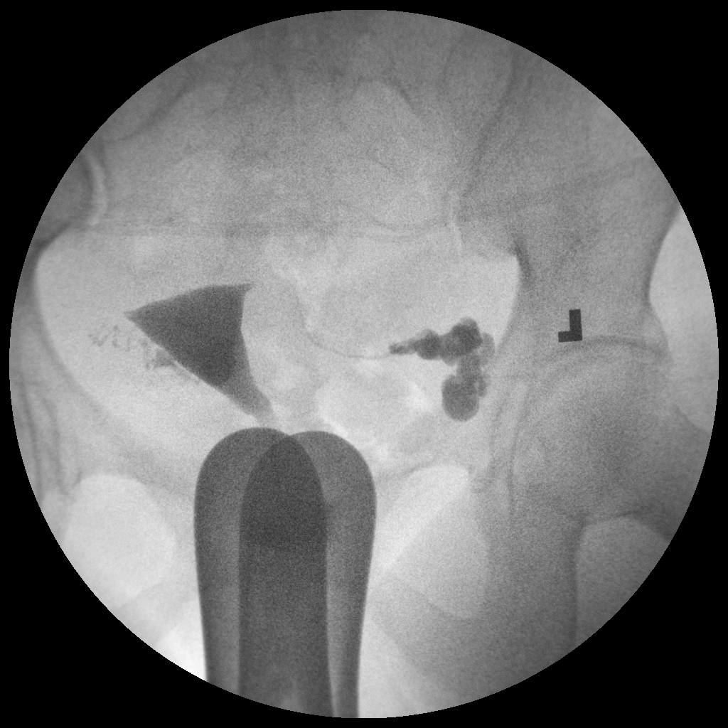

[Series 7: run · 1 of 1 slices shown (7 of 9)]
[im 1/1]
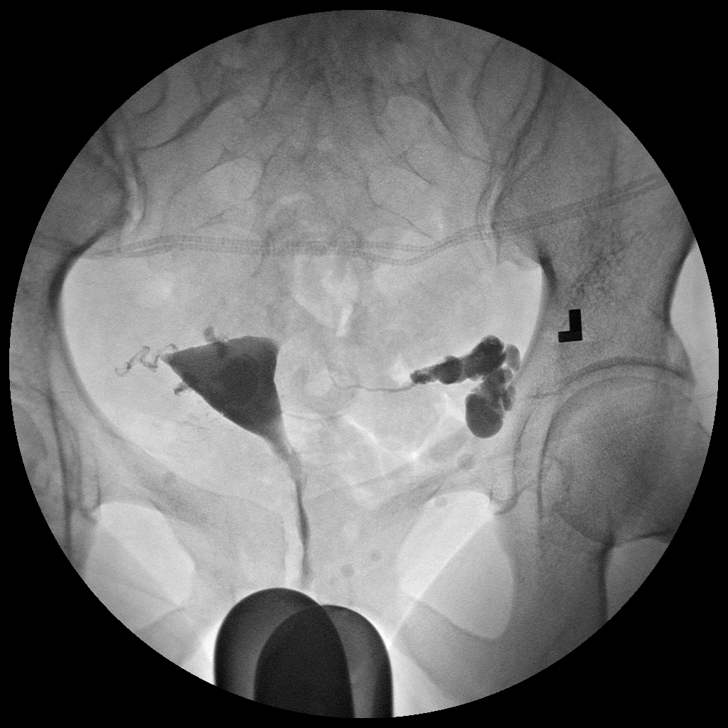

[Series 8: run · 1 of 1 slices shown (8 of 9)]
[im 1/1]
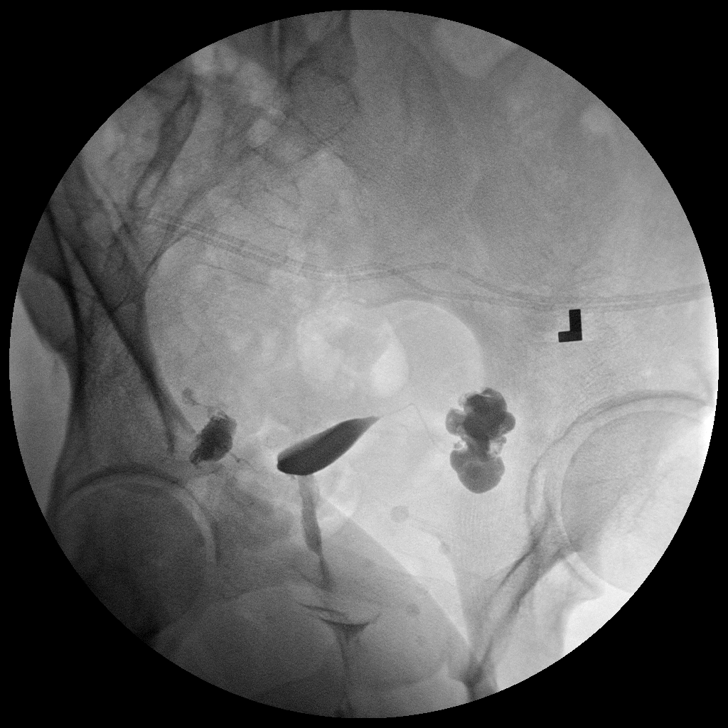

[Series 9: run · 1 of 1 slices shown (9 of 9)]
[im 1/1]
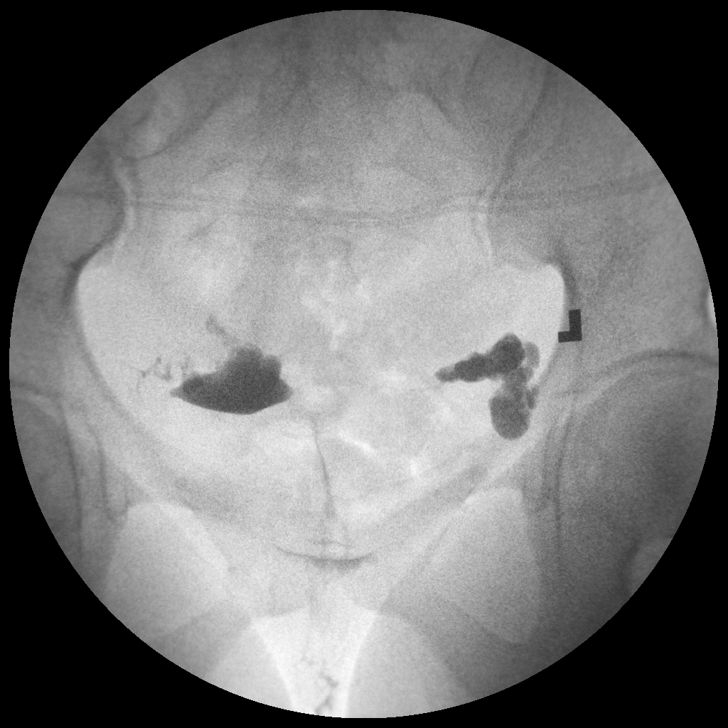

[9 of 9 positions shown; findings below may reference images not displayed]

FINDINGS: A normal endometrial morphology is seen.  Both fallopian
tubes demonstrate a normal morphology to the level of the distal
ampullary region.  On the left the ampullary aspect of the tube has
a distended lobulated morphology compatible with distal tubal
obstruction. The right ampullary portion of the tube has a more
normal caliber but no free intraperitoneal spill is identified on
this side either and distal ampullary obstruction is suspected on
this side as well.
IMPRESSION: Findings worrisome for bilateral distal ampullary tubal
obstruction.

Normal endometrial morphology

## 2015-05-21 ENCOUNTER — Ambulatory Visit (INDEPENDENT_AMBULATORY_CARE_PROVIDER_SITE_OTHER): Payer: 59 | Admitting: Women's Health

## 2015-05-21 ENCOUNTER — Encounter: Payer: Self-pay | Admitting: Women's Health

## 2015-05-21 ENCOUNTER — Other Ambulatory Visit (HOSPITAL_COMMUNITY)
Admission: RE | Admit: 2015-05-21 | Discharge: 2015-05-21 | Disposition: A | Discharge status: Home / Self Care | Payer: 59 | Source: Ambulatory Visit | Attending: Women's Health | Admitting: Women's Health

## 2015-05-21 VITALS — BP 118/80 | Ht 67.0 in | Wt 173.0 lb

## 2015-05-21 DIAGNOSIS — Z01419 Encounter for gynecological examination (general) (routine) without abnormal findings: Secondary | ICD-10-CM | POA: Insufficient documentation

## 2015-05-21 DIAGNOSIS — N898 Other specified noninflammatory disorders of vagina: Secondary | ICD-10-CM | POA: Diagnosis not present

## 2015-05-21 DIAGNOSIS — Z1151 Encounter for screening for human papillomavirus (HPV): Secondary | ICD-10-CM | POA: Insufficient documentation

## 2015-05-21 LAB — CBC WITH DIFFERENTIAL/PLATELET
Basophils Absolute: 0.1 10*3/uL (ref 0.0–0.1)
Basophils Relative: 1 % (ref 0–1)
Eosinophils Absolute: 0.1 10*3/uL (ref 0.0–0.7)
Eosinophils Relative: 2 % (ref 0–5)
HCT: 37.4 % (ref 36.0–46.0)
Hemoglobin: 12.8 g/dL (ref 12.0–15.0)
Lymphocytes Relative: 34 % (ref 12–46)
Lymphs Abs: 1.7 10*3/uL (ref 0.7–4.0)
MCH: 31.8 pg (ref 26.0–34.0)
MCHC: 34.2 g/dL (ref 30.0–36.0)
MCV: 93 fL (ref 78.0–100.0)
MPV: 10.2 fL (ref 8.6–12.4)
Monocytes Absolute: 0.4 10*3/uL (ref 0.1–1.0)
Monocytes Relative: 7 % (ref 3–12)
Neutro Abs: 2.9 10*3/uL (ref 1.7–7.7)
Neutrophils Relative %: 56 % (ref 43–77)
Platelets: 268 10*3/uL (ref 150–400)
RBC: 4.02 MIL/uL (ref 3.87–5.11)
RDW: 13.9 % (ref 11.5–15.5)
WBC: 5.1 10*3/uL (ref 4.0–10.5)

## 2015-05-21 LAB — LIPID PANEL
Cholesterol: 202 mg/dL — ABNORMAL HIGH (ref 125–200)
HDL: 70 mg/dL (ref >=46)
LDL Cholesterol: 121 mg/dL (ref <130)
Total CHOL/HDL Ratio: 2.9 Ratio (ref <=5.0)
Triglycerides: 54 mg/dL (ref <150)
VLDL: 11 mg/dL (ref <30)

## 2015-05-21 LAB — WET PREP FOR TRICH, YEAST, CLUE
Clue Cells Wet Prep HPF POC: NONE SEEN
Trich, Wet Prep: NONE SEEN
WBC, Wet Prep HPF POC: NONE SEEN
Yeast Wet Prep HPF POC: NONE SEEN

## 2015-05-21 LAB — GLUCOSE, RANDOM: Glucose, Bld: 92 mg/dL (ref 65–99)

## 2015-05-21 LAB — TSH: TSH: 0.543 u[IU]/mL (ref 0.350–4.500)

## 2015-05-21 MED ORDER — FLUCONAZOLE 150 MG PO TABS: 150.0000 mg | ORAL_TABLET | Freq: Once | ORAL | Status: DC

## 2015-05-21 NOTE — Patient Instructions (Signed)

## 2015-05-21 NOTE — Progress Notes (Signed)
Kimbery Harwood [facility] December 24, 1980 [id number]    History:    Presents for annual exam.  Regular monthly/ every 28 day for 4-5 day cycle. History of bilateral blocked tubes with tubal revision 06/2014. Failed IVF 2, pregnancy achieved miscarriage shortly after. Normal Pap history.  Past medical history, past surgical history, family history and social history were all reviewed and documented in the EPIC chart. 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  ROS:  A ROS was performed and pertinent positives and negatives are included.  Exam:  Filed Vitals:   05/21/15 1000  BP: 118/80    General appearance:  Normal Thyroid:  Symmetrical, normal in size, without palpable masses or nodularity. Respiratory  Auscultation:  Clear without wheezing or rhonchi Cardiovascular  Auscultation:  Regular rate, without rubs, murmurs or gallops  Edema/varicosities:  Not grossly evident Abdominal  Soft,nontender, without masses, guarding or rebound.  Liver/spleen:  No organomegaly noted  Hernia:  None appreciated  Skin  Inspection:  Grossly normal   Breasts: Examined lying and sitting. Pendulous    Right: Without masses, retractions, discharge or axillary adenopathy.     Left: Without masses, retractions, discharge or axillary adenopathy. Gentitourinary   Inguinal/mons:  Normal without inguinal adenopathy  External genitalia:  Normal  BUS/Urethra/Skene's glands:  Normal  Vagina:  Normal  Cervix:  Normal  Uterus:   normal in size, shape and contour.  Midline and mobile  Adnexa/parametria:     Rt: Without masses or tenderness.   Lt: Without masses or tenderness.  Anus and perineum: Normal  Digital rectal exam: Normal sphincter tone without palpated masses or tenderness  Assessment/Plan:  34 y.o. MBF G2 P0 for annual exam with complaint of upper back and shoulder pain from pendulous breasts.  06/2014 tubal revision for bilateral blocked fallopian tubes/Failed IVF 2  Plan: Follow-up with Dr. Kerin Perna as needed. SBE's,  exercise, calcium rich diet, MVI daily encouraged. Aware of safe pregnancy behaviors. Return to office with missed cycle for viability dating ultrasound. Breast reduction surgery discussed. CBC, glucose, lipid panel, UA, Pap with HR HPV typing, new screening guidelines reviewed.  Huel Cote [facility], 12:45 PM 05/21/2015

## 2015-05-22 LAB — URINALYSIS W MICROSCOPIC + REFLEX CULTURE
Bacteria, UA: NONE SEEN [HPF]
Bilirubin Urine: NEGATIVE
Casts: NONE SEEN [LPF]
Crystals: NONE SEEN [HPF]
Glucose, UA: NEGATIVE
Hgb urine dipstick: NEGATIVE
Ketones, ur: NEGATIVE
Leukocytes, UA: NEGATIVE
Nitrite: NEGATIVE
Protein, ur: NEGATIVE
RBC / HPF: NONE SEEN RBC/HPF (ref <=2)
Specific Gravity, Urine: 1.022 (ref 1.001–1.035)
WBC, UA: NONE SEEN WBC/HPF (ref <=5)
Yeast: NONE SEEN [HPF]
pH: 8 (ref 5.0–8.0)

## 2015-05-24 ENCOUNTER — Encounter: Payer: Self-pay | Admitting: Women's Health

## 2015-05-24 LAB — CYTOLOGY - PAP

## 2015-11-01 ENCOUNTER — Encounter (HOSPITAL_BASED_OUTPATIENT_CLINIC_OR_DEPARTMENT_OTHER): Payer: Self-pay | Admitting: *Deleted

## 2015-11-03 DIAGNOSIS — D250 Submucous leiomyoma of uterus: Secondary | ICD-10-CM | POA: Diagnosis not present

## 2015-11-04 ENCOUNTER — Encounter (HOSPITAL_BASED_OUTPATIENT_CLINIC_OR_DEPARTMENT_OTHER): Payer: Self-pay | Admitting: *Deleted

## 2015-11-04 NOTE — Progress Notes (Signed)
NPO AFTER MN.  ARRIVE AT 0900. NEEDS HG AND URINE PREG. 

## 2015-11-08 NOTE — H&P (Signed)
[name] is a 35 y.o. female , originally referred to me by Dr. Garwin Brothers, for myomectomy.  By recent ultrasound she was diagnosed with a 2.4 x 1.8. x 2.2 cm anterior myoma flush with the uterine cavity  Patient would like to preserve her childbearing potential.  Pertinent Gynecological History: Menses: flow is excessive with use of 3 pads or tampons on heaviest days Bleeding: dysfunctional uterine bleeding Contraception: none DES exposure: denies Blood transfusions: none Sexually transmitted diseases: no past history Previous GYN Procedures: TVOR, R Salpingectomy  Last mammogram: normal Last pap: normal  OB History: G3, 1TOP, 1SAB, 1EP   Menstrual History: Menarche age: 8 No LMP recorded.    Past Medical History  Diagnosis Date  . Uterine fibroid   . Infertility, female                     Past Surgical History  Procedure Laterality Date  . Foot surgery Right 1997    achillis reconstruction  . Diagnostic laparoscopy  2014    egg retrieval  . Laparoscopy Bilateral 07/08/2014    Procedure: LAPAROSCOPY OPERATIVE,RIGHT SALPINGECTOMY AND LEFT SALPINGONEOSTOMY ;  Surgeon: Governor Specking, MD;  Location: [facility];  Service: Gynecology;  Laterality: Bilateral;             Family History  Problem Relation Age of Onset  . Asthma Mother   . Hypertension Father   . Asthma Brother   . Cancer Paternal Uncle     lymphoma  . Diabetes Maternal Grandfather    No hereditary disease.  No cancer of breast, ovary, uterus. No cutaneous leiomyomatosis or renal cell carcinoma.  Social History   Social History  . Marital Status: Married    Spouse Name: N/A  . Number of Children: N/A  . Years of Education: N/A   Occupational History  . Not on file.   Social History Main Topics  . Smoking status: Never Smoker   . Smokeless tobacco: Never Used  . Alcohol Use: Yes     Comment: RARE  . Drug Use: No  . Sexual Activity: Yes   Other Topics Concern  . Not  on file   Social History Narrative    No Known Allergies  No current facility-administered medications on file prior to encounter.   No current outpatient prescriptions on file prior to encounter.     Review of Systems  Constitutional: Negative.   HENT: Negative.   Eyes: Negative.   Respiratory: Negative.   Cardiovascular: Negative.   Gastrointestinal: Negative.   Genitourinary: Negative.   Musculoskeletal: Negative.   Skin: Negative.   Neurological: Negative.   Endo/Heme/Allergies: Negative.   Psychiatric/Behavioral: Negative.      Physical Exam  Ht 5\' 7"  (1.702 m)  Wt 72.576 kg (160 lb)  BMI 25.05 kg/m2  LMP 10/21/2015 (Exact Date) Constitutional: She is oriented to person, place, and time. She appears well-developed and well-nourished.  HENT:  Head: Normocephalic and atraumatic.  Nose: Nose normal.  Mouth/Throat: Oropharynx is clear and moist. No oropharyngeal exudate.  Eyes: Conjunctivae normal and EOM are normal. Pupils are equal, round, and reactive to light. No scleral icterus.  Neck: Normal range of motion. Neck supple. No tracheal deviation present. No thyromegaly present.  Cardiovascular: Normal rate.   Respiratory: Effort normal and breath sounds normal.  GI: Soft. Bowel sounds are normal. She exhibits no distension and no mass. There is no tenderness.  Lymphadenopathy:    She has no cervical adenopathy.  Neurological: She is alert and oriented to person, place, and time. She has normal reflexes.  Skin: Skin is warm.  Psychiatric: She has a normal mood and affect. Her behavior is normal. Judgment and thought content normal.       Assessment/Plan:  2.4 x 1.8. x 2.2 cm anterior myoma is flush with the uterine cavity. Preoperative for myomectomy Benefits and risks of myomectomy were discussed with the patient and her family member again.  Bowel prep instructions were given.  All of patient's questions were answered.  She verbalized understanding.  She  knows that she will need a cesarean delivery for future pregnancies, and that it is recommended she does not conceive for 2-3 months for uterus to heal.

## 2015-11-09 ENCOUNTER — Ambulatory Visit (HOSPITAL_BASED_OUTPATIENT_CLINIC_OR_DEPARTMENT_OTHER)
Admission: RE | Admit: 2015-11-09 | Discharge: 2015-11-09 | Disposition: A | Discharge status: Home / Self Care | Payer: 59 | Source: Ambulatory Visit | Attending: Obstetrics and Gynecology | Admitting: Obstetrics and Gynecology

## 2015-11-09 ENCOUNTER — Ambulatory Visit (HOSPITAL_BASED_OUTPATIENT_CLINIC_OR_DEPARTMENT_OTHER): Payer: 59 | Admitting: Anesthesiology

## 2015-11-09 ENCOUNTER — Encounter (HOSPITAL_BASED_OUTPATIENT_CLINIC_OR_DEPARTMENT_OTHER)
Admission: RE | Disposition: A | Discharge status: Home / Self Care | Payer: Self-pay | Source: Ambulatory Visit | Attending: Obstetrics and Gynecology

## 2015-11-09 ENCOUNTER — Encounter (HOSPITAL_BASED_OUTPATIENT_CLINIC_OR_DEPARTMENT_OTHER): Payer: Self-pay | Admitting: *Deleted

## 2015-11-09 DIAGNOSIS — D251 Intramural leiomyoma of uterus: Secondary | ICD-10-CM | POA: Diagnosis not present

## 2015-11-09 DIAGNOSIS — Z9079 Acquired absence of other genital organ(s): Secondary | ICD-10-CM | POA: Diagnosis not present

## 2015-11-09 DIAGNOSIS — N96 Recurrent pregnancy loss: Secondary | ICD-10-CM | POA: Insufficient documentation

## 2015-11-09 DIAGNOSIS — D259 Leiomyoma of uterus, unspecified: Secondary | ICD-10-CM | POA: Diagnosis not present

## 2015-11-09 HISTORY — PX: LAPAROSCOPIC GELPORT ASSISTED MYOMECTOMY: SHX6549

## 2015-11-09 HISTORY — DX: Female infertility, unspecified: N97.9

## 2015-11-09 HISTORY — DX: Leiomyoma of uterus, unspecified: D25.9

## 2015-11-09 LAB — POCT PREGNANCY, URINE: Preg Test, Ur: NEGATIVE

## 2015-11-09 LAB — POCT HEMOGLOBIN-HEMACUE: Hemoglobin: 12.1 g/dL (ref 12.0–15.0)

## 2015-11-09 SURGERY — LAPAROSCOPIC GELPORT ASSISTED MYOMECTOMY
Anesthesia: General

## 2015-11-09 MED ORDER — EPHEDRINE SULFATE 50 MG/ML IJ SOLN
INTRAMUSCULAR | Status: DC | PRN
  Administered 2015-11-09: 10 mg via INTRAVENOUS

## 2015-11-09 MED ORDER — HYDROMORPHONE HCL 1 MG/ML IJ SOLN
0.2500 mg | INTRAMUSCULAR | Status: DC | PRN
  Filled 2015-11-09: qty 1

## 2015-11-09 MED ORDER — ONDANSETRON HCL 4 MG/2ML IJ SOLN
INTRAMUSCULAR | Status: DC | PRN
  Administered 2015-11-09: 4 mg via INTRAVENOUS

## 2015-11-09 MED ORDER — METHYLENE BLUE 0.5 % INJ SOLN
INTRAVENOUS | Status: DC | PRN
  Administered 2015-11-09: 2 mL via SUBMUCOSAL

## 2015-11-09 MED ORDER — ONDANSETRON HCL 4 MG PO TABS: 4.0000 mg | ORAL_TABLET | Freq: Three times a day (TID) | ORAL | Status: DC | PRN

## 2015-11-09 MED ORDER — CEFAZOLIN SODIUM-DEXTROSE 2-3 GM-% IV SOLR
INTRAVENOUS | Status: AC
  Filled 2015-11-09: qty 50

## 2015-11-09 MED ORDER — FENTANYL CITRATE (PF) 250 MCG/5ML IJ SOLN
INTRAMUSCULAR | Status: AC
  Filled 2015-11-09: qty 5

## 2015-11-09 MED ORDER — LIDOCAINE HCL 4 % EX SOLN
CUTANEOUS | Status: DC | PRN
  Administered 2015-11-09: 2 mL via TOPICAL

## 2015-11-09 MED ORDER — OXYCODONE-ACETAMINOPHEN 7.5-325 MG PO TABS: 1.0000 | ORAL_TABLET | ORAL | Status: DC | PRN

## 2015-11-09 MED ORDER — NEOSTIGMINE METHYLSULFATE 10 MG/10ML IV SOLN
INTRAVENOUS | Status: AC
  Filled 2015-11-09: qty 1

## 2015-11-09 MED ORDER — DEXAMETHASONE SODIUM PHOSPHATE 4 MG/ML IJ SOLN
INTRAMUSCULAR | Status: DC | PRN
  Administered 2015-11-09: 10 mg via INTRAVENOUS

## 2015-11-09 MED ORDER — LACTATED RINGERS IV SOLN
INTRAVENOUS | Status: DC
  Administered 2015-11-09 (×3): via INTRAVENOUS
  Filled 2015-11-09: qty 1000

## 2015-11-09 MED ORDER — KETOROLAC TROMETHAMINE 30 MG/ML IJ SOLN
INTRAMUSCULAR | Status: AC
  Filled 2015-11-09: qty 1

## 2015-11-09 MED ORDER — VASOPRESSIN 20 UNIT/ML IV SOLN
INTRAVENOUS | Status: DC | PRN
  Administered 2015-11-09: 50 mL via INTRAMUSCULAR

## 2015-11-09 MED ORDER — ACETAMINOPHEN 160 MG/5ML PO SOLN
325.0000 mg | ORAL | Status: DC | PRN
  Filled 2015-11-09: qty 20.3

## 2015-11-09 MED ORDER — METHYLENE BLUE 1 % INJ SOLN
INTRAMUSCULAR | Status: AC
  Filled 2015-11-09: qty 10

## 2015-11-09 MED ORDER — ONDANSETRON HCL 4 MG/2ML IJ SOLN
4.0000 mg | Freq: Three times a day (TID) | INTRAMUSCULAR | Status: DC | PRN
  Administered 2015-11-09: 4 mg via INTRAVENOUS
  Filled 2015-11-09: qty 2

## 2015-11-09 MED ORDER — OXYCODONE HCL 5 MG/5ML PO SOLN
5.0000 mg | Freq: Once | ORAL | Status: AC | PRN
  Filled 2015-11-09: qty 5

## 2015-11-09 MED ORDER — CEFAZOLIN SODIUM-DEXTROSE 2-3 GM-% IV SOLR
2.0000 g | INTRAVENOUS | Status: AC
  Administered 2015-11-09: 2 g via INTRAVENOUS
  Filled 2015-11-09: qty 50

## 2015-11-09 MED ORDER — OXYCODONE HCL 5 MG PO TABS
ORAL_TABLET | ORAL | Status: AC
  Filled 2015-11-09: qty 1

## 2015-11-09 MED ORDER — SODIUM CHLORIDE 0.9 % IR SOLN
Status: DC | PRN
  Administered 2015-11-09: 1000 mL
  Administered 2015-11-09: 500 mL

## 2015-11-09 MED ORDER — PROMETHAZINE HCL 25 MG/ML IJ SOLN
12.5000 mg | Freq: Four times a day (QID) | INTRAMUSCULAR | Status: DC | PRN
  Filled 2015-11-09: qty 1

## 2015-11-09 MED ORDER — ONDANSETRON HCL 4 MG/2ML IJ SOLN
INTRAMUSCULAR | Status: AC
  Filled 2015-11-09: qty 2

## 2015-11-09 MED ORDER — MIDAZOLAM HCL 5 MG/5ML IJ SOLN
INTRAMUSCULAR | Status: DC | PRN
  Administered 2015-11-09: 2 mg via INTRAVENOUS

## 2015-11-09 MED ORDER — BUPIVACAINE-EPINEPHRINE (PF) 0.5% -1:200000 IJ SOLN
INTRAMUSCULAR | Status: AC
  Filled 2015-11-09: qty 30

## 2015-11-09 MED ORDER — ROCURONIUM BROMIDE 100 MG/10ML IV SOLN
INTRAVENOUS | Status: AC
  Filled 2015-11-09: qty 1

## 2015-11-09 MED ORDER — DEXAMETHASONE SODIUM PHOSPHATE 10 MG/ML IJ SOLN
INTRAMUSCULAR | Status: AC
  Filled 2015-11-09: qty 1

## 2015-11-09 MED ORDER — SUCCINYLCHOLINE CHLORIDE 20 MG/ML IJ SOLN
INTRAMUSCULAR | Status: DC | PRN
  Administered 2015-11-09: 100 mg via INTRAVENOUS

## 2015-11-09 MED ORDER — ACETAMINOPHEN 325 MG PO TABS
325.0000 mg | ORAL_TABLET | ORAL | Status: DC | PRN
  Filled 2015-11-09: qty 2

## 2015-11-09 MED ORDER — GLYCOPYRROLATE 0.2 MG/ML IJ SOLN
INTRAMUSCULAR | Status: AC
  Filled 2015-11-09: qty 3

## 2015-11-09 MED ORDER — NEOSTIGMINE METHYLSULFATE 10 MG/10ML IV SOLN
INTRAVENOUS | Status: DC | PRN
  Administered 2015-11-09: 4 mg via INTRAVENOUS

## 2015-11-09 MED ORDER — LACTATED RINGERS IR SOLN
Status: DC | PRN
  Administered 2015-11-09: 3000 mL

## 2015-11-09 MED ORDER — FENTANYL CITRATE (PF) 100 MCG/2ML IJ SOLN
INTRAMUSCULAR | Status: DC | PRN
  Administered 2015-11-09 (×4): 50 ug via INTRAVENOUS

## 2015-11-09 MED ORDER — GLYCOPYRROLATE 0.2 MG/ML IJ SOLN
INTRAMUSCULAR | Status: DC | PRN
  Administered 2015-11-09: 0.6 mg via INTRAVENOUS

## 2015-11-09 MED ORDER — LIDOCAINE HCL (CARDIAC) 20 MG/ML IV SOLN
INTRAVENOUS | Status: DC | PRN
  Administered 2015-11-09: 30 mg via INTRAVENOUS

## 2015-11-09 MED ORDER — LIDOCAINE HCL (CARDIAC) 20 MG/ML IV SOLN
INTRAVENOUS | Status: AC
  Filled 2015-11-09: qty 5

## 2015-11-09 MED ORDER — PROPOFOL 10 MG/ML IV BOLUS
INTRAVENOUS | Status: AC
  Filled 2015-11-09: qty 40

## 2015-11-09 MED ORDER — ROCURONIUM BROMIDE 100 MG/10ML IV SOLN
INTRAVENOUS | Status: DC | PRN
  Administered 2015-11-09: 30 mg via INTRAVENOUS

## 2015-11-09 MED ORDER — KETOROLAC TROMETHAMINE 30 MG/ML IJ SOLN
INTRAMUSCULAR | Status: DC | PRN
  Administered 2015-11-09: 30 mg via INTRAVENOUS

## 2015-11-09 MED ORDER — PROPOFOL 10 MG/ML IV BOLUS
INTRAVENOUS | Status: DC | PRN
  Administered 2015-11-09: 200 mg via INTRAVENOUS

## 2015-11-09 MED ORDER — OXYCODONE HCL 5 MG PO TABS
5.0000 mg | ORAL_TABLET | Freq: Once | ORAL | Status: AC | PRN
  Administered 2015-11-09: 5 mg via ORAL
  Filled 2015-11-09: qty 1

## 2015-11-09 MED ORDER — MIDAZOLAM HCL 2 MG/2ML IJ SOLN
INTRAMUSCULAR | Status: AC
  Filled 2015-11-09: qty 2

## 2015-11-09 SURGICAL SUPPLY — 75 items
APPLICATOR COTTON TIP 6IN STRL (MISCELLANEOUS) ×2 IMPLANT
BLADE SURG 10 STRL SS (BLADE) ×2 IMPLANT
BLADE SURG 11 STRL SS (BLADE) ×2 IMPLANT
CHLORAPREP W/TINT 26ML (MISCELLANEOUS) ×2 IMPLANT
CLEANER CAUTERY TIP 5X5 PAD (MISCELLANEOUS) IMPLANT
CONT SPECI 4OZ STER CLIK (MISCELLANEOUS) ×2 IMPLANT
COVER MAYO STAND STRL (DRAPES) ×2 IMPLANT
DEVICE TROCAR PUNCTURE CLOSURE (ENDOMECHANICALS) IMPLANT
DRAPE UNDERBUTTOCKS STRL (DRAPE) ×2 IMPLANT
DRSG OPSITE POSTOP 3X4 (GAUZE/BANDAGES/DRESSINGS) IMPLANT
DRSG TEGADERM 4X4.75 (GAUZE/BANDAGES/DRESSINGS) IMPLANT
DRSG TELFA 3X8 NADH (GAUZE/BANDAGES/DRESSINGS) IMPLANT
ELECT BLADE 6.5 .24CM SHAFT (ELECTRODE) IMPLANT
ELECT NEEDLE TIP 2.8 STRL (NEEDLE) IMPLANT
ELECT REM PT RETURN 9FT ADLT (ELECTROSURGICAL) ×2
ELECTRODE REM PT RTRN 9FT ADLT (ELECTROSURGICAL) ×1 IMPLANT
FILTER SMOKE EVAC LAPAROSHD (FILTER) ×2 IMPLANT
GLOVE BIO SURGEON STRL SZ8 (GLOVE) ×2 IMPLANT
GLOVE BIOGEL PI IND STRL 8.5 (GLOVE) ×1 IMPLANT
GLOVE BIOGEL PI INDICATOR 8.5 (GLOVE) ×1
GOWN STRL REUS W/ TWL LRG LVL3 (GOWN DISPOSABLE) ×2 IMPLANT
GOWN STRL REUS W/TWL LRG LVL3 (GOWN DISPOSABLE) ×2
HOLDER FOLEY CATH W/STRAP (MISCELLANEOUS) IMPLANT
KIT ROOM TURNOVER WOR (KITS) ×2 IMPLANT
LEGGING LITHOTOMY PAIR STRL (DRAPES) ×2 IMPLANT
LIQUID BAND (GAUZE/BANDAGES/DRESSINGS) ×2 IMPLANT
MANIPULATOR UTERINE 4.5 ZUMI (MISCELLANEOUS) ×2 IMPLANT
NDL SAFETY ECLIPSE 18X1.5 (NEEDLE) ×1 IMPLANT
NEEDLE HYPO 18GX1.5 SHARP (NEEDLE) ×1
NEEDLE HYPO 22GX1.5 SAFETY (NEEDLE) ×2 IMPLANT
NEEDLE HYPO 25X1 1.5 SAFETY (NEEDLE) ×2 IMPLANT
NEEDLE INSUFFLATION 14GA 120MM (NEEDLE) ×2 IMPLANT
NS IRRIG 500ML POUR BTL (IV SOLUTION) ×2 IMPLANT
PACK BASIN DAY SURGERY FS (CUSTOM PROCEDURE TRAY) ×2 IMPLANT
PACK LAPAROSCOPY II (CUSTOM PROCEDURE TRAY) ×2 IMPLANT
PAD CLEANER CAUTERY TIP 5X5 (MISCELLANEOUS)
PAD ION RIGHT ARM DISP (MISCELLANEOUS) ×2 IMPLANT
PAD OB MATERNITY 4.3X12.25 (PERSONAL CARE ITEMS) ×2 IMPLANT
PENCIL BUTTON HOLSTER BLD 10FT (ELECTRODE) IMPLANT
POUCH SPECIMEN RETRIEVAL 10MM (ENDOMECHANICALS) IMPLANT
SCALPEL HARMONIC ACE (MISCELLANEOUS) IMPLANT
SEALER TISSUE G2 CVD JAW 35 (ENDOMECHANICALS) IMPLANT
SEALER TISSUE G2 CVD JAW 45CM (ENDOMECHANICALS) IMPLANT
SEPRAFILM MEMBRANE 5X6 (MISCELLANEOUS) ×2 IMPLANT
SET IRRIG TUBING LAPAROSCOPIC (IRRIGATION / IRRIGATOR) ×2 IMPLANT
SOLUTION ANTI FOG 6CC (MISCELLANEOUS) ×2 IMPLANT
SPONGE LAP 18X18 X RAY DECT (DISPOSABLE) IMPLANT
STOPCOCK 4 WAY LG BORE MALE ST (IV SETS) ×2 IMPLANT
SUT MNCRL AB 4-0 PS2 18 (SUTURE) ×2 IMPLANT
SUT PROLENE 0 CT 1 30 (SUTURE) IMPLANT
SUT VIC AB 0 CT1 36 (SUTURE) IMPLANT
SUT VIC AB 2-0 CT1 27 (SUTURE) ×1
SUT VIC AB 2-0 CT1 TAPERPNT 27 (SUTURE) ×1 IMPLANT
SUT VIC AB 2-0 CT2 27 (SUTURE) ×2 IMPLANT
SUT VIC AB 2-0 UR6 27 (SUTURE) IMPLANT
SUT VIC AB 4-0 SH 27 (SUTURE) ×1
SUT VIC AB 4-0 SH 27XANBCTRL (SUTURE) ×1 IMPLANT
SUT VICRYL 0 TIES 12 18 (SUTURE) IMPLANT
SYR 20CC LL (SYRINGE) IMPLANT
SYR 30ML LL (SYRINGE) ×2 IMPLANT
SYR 3ML 23GX1 SAFETY (SYRINGE) ×2 IMPLANT
SYR 50ML LL SCALE MARK (SYRINGE) ×2 IMPLANT
SYR 5ML LL (SYRINGE) IMPLANT
SYR CONTROL 10ML LL (SYRINGE) ×4 IMPLANT
SYRINGE 10CC LL (SYRINGE) IMPLANT
SYS LAPSCP GELPORT 120MM (MISCELLANEOUS) ×2
SYSTEM LAPSCP GELPORT 120MM (MISCELLANEOUS) ×1 IMPLANT
TOWEL OR 17X24 6PK STRL BLUE (TOWEL DISPOSABLE) ×4 IMPLANT
TRAY DSU PREP LF (CUSTOM PROCEDURE TRAY) ×2 IMPLANT
TRAY FOLEY CATH SILVER 14FR (SET/KITS/TRAYS/PACK) ×2 IMPLANT
TROCAR OPTI TIP 5M 100M (ENDOMECHANICALS) ×6 IMPLANT
TROCAR XCEL DIL TIP R 11M (ENDOMECHANICALS) ×2 IMPLANT
TUBE CONNECTING 12X1/4 (SUCTIONS) IMPLANT
TUBING INSUFFLATION 10FT LAP (TUBING) ×2 IMPLANT
WARMER LAPAROSCOPE (MISCELLANEOUS) ×2 IMPLANT

## 2015-11-09 NOTE — Discharge Instructions (Addendum)
Laparoscopy, Care After Refer to this sheet in the next few weeks. These instructions provide you with information about caring for yourself after your procedure. Your health care provider may also give you more specific instructions. Your treatment has been planned according to current medical practices, but problems sometimes occur. Call your health care provider if you have any problems or questions after your procedure. WHAT TO EXPECT AFTER THE PROCEDURE After your procedure, it is common to have:  Sore throat.  Soreness at the incision site.  Mild cramping.  Tiredness.  Mild nausea or vomiting.  Shoulder pain. HOME CARE INSTRUCTIONS  Rest for the remainder of the day.  Take medicines only as directed by your health care provider. These include over-the-counter medicines and prescription medicines. Do not take aspirin, which can cause bleeding.  Over the next few days, gradually return to your normal activities and your normal diet.  Avoid sexual intercourse for 2 weeks or as directed by your health care provider.  Do not use tampons, and do not douche.  Do not drive or operate heavy machinery while taking pain medicine.  Do not lift anything that is heavier than 5 lb (2.3 kg) for 2 weeks or as directed by your health care provider.  Do not take baths. Take showers only. Ask your health care provider when you can start taking baths.  Take your temperature twice each day and write it down.  Try to have help for your household needs for the first 7-10 days.  There are many different ways to close and cover an incision, including stitches (sutures), skin glue, and adhesive strips. Follow instructions from your health care provider about:  Incision care.  Bandage (dressing) changes and removal.  Incision closure removal.  Check your incision area every day for signs of infection. Watch for:  Redness, swelling, or pain.  Fluid, blood, or pus.  Keep all follow-up  visits as directed by your health care provider. SEEK MEDICAL CARE IF:  You have redness, swelling, or increasing pain in your incision area.  You have fluid, blood, or pus coming from your incision for longer than 1 day.  You notice a bad smell coming from your incision or your dressing.  The edges of your incision break open after the sutures have been removed.  Your pain does not decrease after 2-3 days.  You have a rash.  You repeatedly become dizzy or light-headed.  You have a reaction to your medicine.  Your pain medicine is not helping.  You are constipated. SEEK IMMEDIATE MEDICAL CARE IF:  You have a fever.  You faint.  You have increasing pain in your abdomen.  You have severe pain in one or both of your shoulders.  You have bleeding or drainage from your suture sites or your vagina after surgery.  You have shortness of breath or have difficulty breathing.  You have chest pain or leg pain.  You have ongoing nausea, vomiting, or diarrhea.   This information is not intended to replace advice given to you by your health care provider. Make sure you discuss any questions you have with your health care provider.   Document Released: 03/31/2005 Document Revised: 01/26/2015 Document Reviewed: 12/23/2011 Elsevier Interactive Patient Education 2016 [place] [facility] Home Care Instructions  Activity: Get plenty of rest for the remainder of the day. A responsible adult should stay with you for 24 hours following the procedure.  For the next 24 hours, DO NOT: -Drive a car -Operate  machinery -Drink alcoholic beverages -Take any medication unless instructed by your physician -Make any legal decisions or sign important papers.  Meals: Start with liquid foods such as gelatin or soup. Progress to regular foods as tolerated. Avoid greasy, spicy, heavy foods. If nausea and/or vomiting occur, drink only clear liquids until the nausea and/or vomiting  subsides. Call your physician if vomiting continues.  Special Instructions/Symptoms: Your throat may feel dry or sore from the anesthesia or the breathing tube placed in your throat during surgery. If this causes discomfort, gargle with warm salt water. The discomfort should disappear within 24 hours.  If you had a scopolamine patch placed behind your ear for the management of post- operative nausea and/or vomiting:  1. The medication in the patch is effective for 72 hours, after which it should be removed.  Wrap patch in a tissue and discard in the trash. Wash hands thoroughly with soap and water. 2. You may remove the patch earlier than 72 hours if you experience unpleasant side effects which may include dry mouth, dizziness or visual disturbances. 3. Avoid touching the patch. Wash your hands with soap and water after contact with the patch.

## 2015-11-09 NOTE — Anesthesia Preprocedure Evaluation (Signed)
Anesthesia Evaluation  Patient identified by MRN, date of birth, ID band Patient awake    Reviewed: Allergy & Precautions, NPO status , Patient's Chart, lab work & pertinent test results  History of Anesthesia Complications Negative for: history of anesthetic complications  Airway Mallampati: I  TM Distance: >3 FB Neck ROM: Full    Dental  (+) Teeth Intact   Pulmonary neg pulmonary ROS,    breath sounds clear to auscultation       Cardiovascular negative cardio ROS   Rhythm:Regular     Neuro/Psych negative neurological ROS  negative psych ROS   GI/Hepatic negative GI ROS, Neg liver ROS,   Endo/Other  negative endocrine ROS  Renal/GU negative Renal ROS     Musculoskeletal   Abdominal   Peds  Hematology negative hematology ROS (+)   Anesthesia Other Findings   Reproductive/Obstetrics                             Anesthesia Physical Anesthesia Plan  ASA: I  Anesthesia Plan: General   Post-op Pain Management:    Induction: Intravenous  Airway Management Planned: Oral ETT  Additional Equipment: None  Intra-op Plan:   Post-operative Plan: Extubation in OR  Informed Consent: I have reviewed the patients History and Physical, chart, labs and discussed the procedure including the risks, benefits and alternatives for the proposed anesthesia with the patient or authorized representative who has indicated his/her understanding and acceptance.   Dental advisory given  Plan Discussed with: CRNA and Surgeon  Anesthesia Plan Comments:         Anesthesia Quick Evaluation

## 2015-11-09 NOTE — Anesthesia Procedure Notes (Signed)
Procedure Name: Intubation Date/Time: 11/09/2015 10:52 AM Performed by: Mechele Claude Pre-anesthesia Checklist: Patient identified, Emergency Drugs available, Suction available and Patient being monitored Patient Re-evaluated:Patient Re-evaluated prior to inductionOxygen Delivery Method: Circle System Utilized Preoxygenation: Pre-oxygenation with 100% oxygen Intubation Type: IV induction Ventilation: Mask ventilation without difficulty Laryngoscope Size: Mac and 3 Grade View: Grade I Tube type: Oral Tube size: 7.0 mm Number of attempts: 1 Airway Equipment and Method: Stylet and LTA kit utilized Placement Confirmation: ETT inserted through vocal cords under direct vision,  positive ETCO2 and breath sounds checked- equal and bilateral Secured at: 21 cm Tube secured with: Tape Dental Injury: Teeth and Oropharynx as per pre-operative assessment

## 2015-11-09 NOTE — Transfer of Care (Signed)
Last Vitals:  Filed Vitals:   11/09/15 0919  BP: 143/98  Pulse: 86  Temp: 37.1 C  Resp: 12    Immediate Anesthesia Transfer of Care Note  Patient: [name]  Procedure(s) Performed: Procedure(s) (LRB): LAPAROSCOPIC GELPORT ASSISTED MYOMECTOMY; CHROMOTUBATION (N/A)  Patient Location: PACU  Anesthesia Type: General  Level of Consciousness: awake, alert  and oriented  Airway & Oxygen Therapy: Patient Spontanous Breathing and Patient connected to face mask oxygen  Post-op Assessment: Report given to PACU RN and Post -op Vital signs reviewed and stable  Post vital signs: Reviewed and stable  Complications: No apparent anesthesia complications

## 2015-11-09 NOTE — Op Note (Addendum)
Operative Note  Preoperative diagnosis: Uterine fibroids, recurrent miscarriage  Postoperative diagnosis: Uterine fibroids, recurrent miscarriage  Procedure: Laparoscopy, GelPort assisted myomectomy, chromotubation, endometrial biopsy  Anesthesia: Gen. endotracheal  Complications: None  Estimated blood loss: <20 mL  Specimens: Fibroids 2, endometrial biopsy to pathology  Findings: On exam under anesthesia, the uterus was enlarged to 8-9 weeks size with multiple nodularities, suggestive of myomas. It sounded to 9.5 cm. There were no adnexal masses. There was no cul-de-sac induration or nodularity.  On laparoscopy, there were perihepatic adhesions to the diaphragm. The appendix was normal. The pelvic peritoneum looked mostly normal.  The uterus contained a 3.5 x 3.5 cm anterior transmural myoma that was distorting the cavity on preop ultrasounds, as well as 3 other fibroids that were subserosal in location, ranging in size from 0.3 cm to 1 cm.  The right tube was surgically absent. The left tube was splayed over the left ovary with fimbria identifiable. The tube did not fill during chromotubation. His may have been technical problem. There was no area suspicious for endometriosis.  Both ovaries appeared normal as well as the ovarian fossae.   Description of the procedure: The patient was placed in dorsal supine position and general endotracheal anesthesia was given. 2 g of cefazolin were given intravenously for prophylaxis. Patient was placed in lithotomy position. She was prepped and draped in sterile manner.  A Foley catheter was inserted into the bladder.  A ZUMI catheter was placed into the uterine cavity. This was connected to a syringe containing diluted methylene blue solution. Uterus sounded to 9.5 cm. The surgeon was regloved and a surgical field was created on the abdomen.  After preemptive anesthesia of all surgical sites with 0.5% bupivacaine with 1:200,000 epinephrine, a 5 mm  intraumbilical skin incision was made and a Verress needle was inserted. Its correct location was confirmed. A pneumoperitoneum was created with carbon dioxide.  5 mm laparoscope with a 30 lens was inserted and video laparoscopy was started . A left lower quadrant 5 mm and a right lower quadrant  5 mm incisions were made at the previous scars and ancillary trochars were placed under direct visualization. Above findings were noted.  Dilute vasopressin (0.4 units per mL) was injected into the uterus. An incision was made over the anterior myometrium overlying the marches myoma endometrioma enucleation was started. The myoma was removed and was noted to be immediately adjacent to the anterior endometrial wall.  At this point, we decided to make the 3 cm suprapubic transverse incision, after injecting preemptive anesthesia. After dissection of the anatomic layers the peritoneal cavity was entered. A GelPort was placed. This was used as a laparoscopic port part of the time and was turned into a minilaparotomy port the rest of the time. All of the visible myomas were held on traction their base was dissected with needle electrode and the myomas were removed. The defects from the intramural myomas were closed in 3 layers: 2-0 Vicryl continuous interlocking suture on the deep myometrium, 2-0 Vicryl continuous suture on the superficial myometrium and 4-0 Vicryl continuous suture on the serosa. The defects from the subserosal fibroids were closed with 4-0 Vicryl figure-of-eight sutures. Chromotubation was performed to define the endometrial cavity during the procedure. The left tube did not fill, which may have been due to a technical problem. The abdomen was reinsufflated after closing the GelPort. The pelvis was copiously irrigated and aspirated. Hemostasis was insured. A slurry of 1 sheet of Seprafilm in 60 mL of saline  was prepared and was instilled into the pelvis as an adhesion barrier. An endometrial biopsy was  performed at the end of the procedure with the Pipelle device. The 3 cm lower transverse incisions fascial layer was closed with 2-0 Vicryl continuous suture. The subcutaneous dense tissue was irrigated. All of the skin incisions were approximated with 4-0 Monocryl in subcuticular stitches.  The patient tolerated the procedure well and was transferred to recovery room in satisfactory condition.  SPECIAL NOTE: Because of the extent of the myometrial incision for the anterior myoma, cesarean delivery is recommended for future pregnancies in this patient.  Governor Specking, MD

## 2015-11-09 NOTE — Anesthesia Postprocedure Evaluation (Signed)
Anesthesia Post Note  Patient: [facility]  Procedure(s) Performed: Procedure(s) (LRB): LAPAROSCOPIC GELPORT ASSISTED MYOMECTOMY; CHROMOTUBATION (N/A)  Patient location during evaluation: PACU Anesthesia Type: General Level of consciousness: awake and alert Pain management: pain level controlled Vital Signs Assessment: post-procedure vital signs reviewed and stable Respiratory status: spontaneous breathing, nonlabored ventilation, respiratory function stable and patient connected to nasal cannula oxygen Cardiovascular status: blood pressure returned to baseline and stable Postop Assessment: no signs of nausea or vomiting Anesthetic complications: no    Last Vitals:  Filed Vitals:   11/09/15 1430 11/09/15 1446  BP: 113/62 123/64  Pulse: 77 87  Temp:    Resp: 20 20    Last Pain:  Filed Vitals:   11/09/15 1503  PainSc: 4                  Marchella Hibbard J

## 2015-11-10 ENCOUNTER — Encounter (HOSPITAL_BASED_OUTPATIENT_CLINIC_OR_DEPARTMENT_OTHER): Payer: Self-pay | Admitting: Obstetrics and Gynecology

## 2015-11-12 DIAGNOSIS — L810 Postinflammatory hyperpigmentation: Secondary | ICD-10-CM | POA: Diagnosis not present

## 2015-11-12 DIAGNOSIS — L700 Acne vulgaris: Secondary | ICD-10-CM | POA: Diagnosis not present

## 2016-01-10 MED FILL — PROGESTERONE OIL 50 MG/ML V: 50 | 30 days supply | Qty: 30 | Fill #0

## 2016-01-10 MED FILL — BD NEEDLES 22GX1.5: 22G X 1-1/2 | 30 days supply | Qty: 30 | Fill #0

## 2016-01-10 MED FILL — ESTRADIOL 2 MG TABLET: 2 | 30 days supply | Qty: 60 | Fill #0

## 2016-01-10 MED FILL — BD 3 ML SYRINGE 18GX1-1/2: 18G X 1-1/2 | 30 days supply | Qty: 30 | Fill #0

## 2016-01-10 MED FILL — METHYLPREDNISOLONE 4 MG TAB: 4 | 4 days supply | Qty: 16 | Fill #0

## 2016-01-21 DIAGNOSIS — Z48816 Encounter for surgical aftercare following surgery on the genitourinary system: Secondary | ICD-10-CM | POA: Diagnosis not present

## 2016-01-24 MED FILL — METHYLPREDNISOLONE 4 MG TAB: 4 | 4 days supply | Qty: 16 | Fill #0

## 2016-01-24 MED FILL — MINIVELLE 0.1 MG PATCH: 0.1 | 24 days supply | Qty: 8 | Fill #0

## 2016-01-27 DIAGNOSIS — Z3183 Encounter for assisted reproductive fertility procedure cycle: Secondary | ICD-10-CM | POA: Diagnosis not present

## 2016-02-04 DIAGNOSIS — Z3201 Encounter for pregnancy test, result positive: Secondary | ICD-10-CM | POA: Diagnosis not present

## 2016-02-07 DIAGNOSIS — Z3200 Encounter for pregnancy test, result unknown: Secondary | ICD-10-CM | POA: Diagnosis not present

## 2016-02-17 DIAGNOSIS — Z3200 Encounter for pregnancy test, result unknown: Secondary | ICD-10-CM | POA: Diagnosis not present

## 2016-02-18 MED FILL — PROGESTERONE OIL 50 MG/ML V: 50 | 30 days supply | Qty: 30 | Fill #1

## 2016-02-18 MED FILL — BD 3 ML SYRINGE 18GX1-1/2: 18G X 1-1/2 | 30 days supply | Qty: 30 | Fill #1

## 2016-02-18 MED FILL — BD NEEDLES 22GX1.5: 22G X 1-1/2 | 30 days supply | Qty: 30 | Fill #1

## 2016-02-29 DIAGNOSIS — O0900 Supervision of pregnancy with history of infertility, unspecified trimester: Secondary | ICD-10-CM | POA: Diagnosis not present

## 2016-03-08 MED FILL — CRINONE 8% GEL: 8 | 14 days supply | Qty: 34 | Fill #0

## 2016-03-10 DIAGNOSIS — Z36 Encounter for antenatal screening of mother: Secondary | ICD-10-CM | POA: Diagnosis not present

## 2016-03-10 DIAGNOSIS — O09521 Supervision of elderly multigravida, first trimester: Secondary | ICD-10-CM | POA: Diagnosis not present

## 2016-03-10 LAB — OB RESULTS CONSOLE HIV ANTIBODY (ROUTINE TESTING): HIV: NONREACTIVE

## 2016-03-10 LAB — OB RESULTS CONSOLE RUBELLA ANTIBODY, IGM: Rubella: IMMUNE

## 2016-03-10 LAB — OB RESULTS CONSOLE ABO/RH: RH Type: POSITIVE

## 2016-03-10 LAB — OB RESULTS CONSOLE GC/CHLAMYDIA
Chlamydia: NEGATIVE
Gonorrhea: NEGATIVE

## 2016-03-10 LAB — OB RESULTS CONSOLE HEPATITIS B SURFACE ANTIGEN: Hepatitis B Surface Ag: NEGATIVE

## 2016-03-10 LAB — OB RESULTS CONSOLE RPR: RPR: NONREACTIVE

## 2016-03-10 LAB — OB RESULTS CONSOLE ANTIBODY SCREEN: Antibody Screen: NEGATIVE

## 2016-03-17 DIAGNOSIS — Z36 Encounter for antenatal screening of mother: Secondary | ICD-10-CM | POA: Diagnosis not present

## 2016-03-17 DIAGNOSIS — Z3A09 9 weeks gestation of pregnancy: Secondary | ICD-10-CM | POA: Diagnosis not present

## 2016-03-17 DIAGNOSIS — O30049 Twin pregnancy, dichorionic/diamniotic, unspecified trimester: Secondary | ICD-10-CM | POA: Diagnosis not present

## 2016-03-17 DIAGNOSIS — O30041 Twin pregnancy, dichorionic/diamniotic, first trimester: Secondary | ICD-10-CM | POA: Diagnosis not present

## 2016-05-04 DIAGNOSIS — O09522 Supervision of elderly multigravida, second trimester: Secondary | ICD-10-CM | POA: Diagnosis not present

## 2016-05-04 DIAGNOSIS — Z3A16 16 weeks gestation of pregnancy: Secondary | ICD-10-CM | POA: Diagnosis not present

## 2016-05-04 DIAGNOSIS — Z36 Encounter for antenatal screening of mother: Secondary | ICD-10-CM | POA: Diagnosis not present

## 2016-05-04 DIAGNOSIS — Z113 Encounter for screening for infections with a predominantly sexual mode of transmission: Secondary | ICD-10-CM | POA: Diagnosis not present

## 2016-05-04 DIAGNOSIS — O30042 Twin pregnancy, dichorionic/diamniotic, second trimester: Secondary | ICD-10-CM | POA: Diagnosis not present

## 2016-05-23 ENCOUNTER — Encounter: Payer: 59 | Admitting: Women's Health

## 2016-05-24 DIAGNOSIS — O30042 Twin pregnancy, dichorionic/diamniotic, second trimester: Secondary | ICD-10-CM | POA: Diagnosis not present

## 2016-05-24 DIAGNOSIS — Z3A19 19 weeks gestation of pregnancy: Secondary | ICD-10-CM | POA: Diagnosis not present

## 2016-06-14 DIAGNOSIS — O30042 Twin pregnancy, dichorionic/diamniotic, second trimester: Secondary | ICD-10-CM | POA: Diagnosis not present

## 2016-06-14 DIAGNOSIS — Z3A22 22 weeks gestation of pregnancy: Secondary | ICD-10-CM | POA: Diagnosis not present

## 2016-07-07 DIAGNOSIS — Z363 Encounter for antenatal screening for malformations: Secondary | ICD-10-CM | POA: Diagnosis not present

## 2016-07-07 DIAGNOSIS — Z23 Encounter for immunization: Secondary | ICD-10-CM | POA: Diagnosis not present

## 2016-07-18 DIAGNOSIS — Z3689 Encounter for other specified antenatal screening: Secondary | ICD-10-CM | POA: Diagnosis not present

## 2016-08-02 DIAGNOSIS — O30042 Twin pregnancy, dichorionic/diamniotic, second trimester: Secondary | ICD-10-CM | POA: Diagnosis not present

## 2016-08-02 DIAGNOSIS — Z23 Encounter for immunization: Secondary | ICD-10-CM | POA: Diagnosis not present

## 2016-08-02 DIAGNOSIS — Z3A29 29 weeks gestation of pregnancy: Secondary | ICD-10-CM | POA: Diagnosis not present

## 2016-08-03 ENCOUNTER — Ambulatory Visit (INDEPENDENT_AMBULATORY_CARE_PROVIDER_SITE_OTHER): Payer: Self-pay | Admitting: Pediatrics

## 2016-08-03 DIAGNOSIS — Z3490 Encounter for supervision of normal pregnancy, unspecified, unspecified trimester: Secondary | ICD-10-CM

## 2016-08-03 DIAGNOSIS — Z7681 Expectant parent(s) prebirth pediatrician visit: Secondary | ICD-10-CM

## 2016-08-03 DIAGNOSIS — Q241 Levocardia: Secondary | ICD-10-CM | POA: Diagnosis not present

## 2016-08-03 DIAGNOSIS — O30043 Twin pregnancy, dichorionic/diamniotic, third trimester: Secondary | ICD-10-CM | POA: Diagnosis not present

## 2016-08-03 DIAGNOSIS — Z3A29 29 weeks gestation of pregnancy: Secondary | ICD-10-CM | POA: Diagnosis not present

## 2016-08-03 DIAGNOSIS — O09813 Supervision of pregnancy resulting from assisted reproductive technology, third trimester: Secondary | ICD-10-CM | POA: Diagnosis not present

## 2016-08-03 NOTE — Progress Notes (Signed)
Prenatal counseling for impending newborn done-- Z76.81  

## 2016-08-08 ENCOUNTER — Other Ambulatory Visit: Payer: Self-pay | Admitting: Obstetrics and Gynecology

## 2016-08-23 DIAGNOSIS — O30043 Twin pregnancy, dichorionic/diamniotic, third trimester: Secondary | ICD-10-CM | POA: Diagnosis not present

## 2016-08-23 DIAGNOSIS — Z3A32 32 weeks gestation of pregnancy: Secondary | ICD-10-CM | POA: Diagnosis not present

## 2016-08-31 DIAGNOSIS — O30043 Twin pregnancy, dichorionic/diamniotic, third trimester: Secondary | ICD-10-CM | POA: Diagnosis not present

## 2016-08-31 DIAGNOSIS — Z3A33 33 weeks gestation of pregnancy: Secondary | ICD-10-CM | POA: Diagnosis not present

## 2016-09-06 DIAGNOSIS — O30043 Twin pregnancy, dichorionic/diamniotic, third trimester: Secondary | ICD-10-CM | POA: Diagnosis not present

## 2016-09-06 DIAGNOSIS — Z3A34 34 weeks gestation of pregnancy: Secondary | ICD-10-CM | POA: Diagnosis not present

## 2016-09-12 DIAGNOSIS — Z3685 Encounter for antenatal screening for Streptococcus B: Secondary | ICD-10-CM | POA: Diagnosis not present

## 2016-09-12 DIAGNOSIS — Z3A35 35 weeks gestation of pregnancy: Secondary | ICD-10-CM | POA: Diagnosis not present

## 2016-09-12 DIAGNOSIS — O30043 Twin pregnancy, dichorionic/diamniotic, third trimester: Secondary | ICD-10-CM | POA: Diagnosis not present

## 2016-09-13 ENCOUNTER — Encounter (HOSPITAL_COMMUNITY): Payer: Self-pay | Admitting: *Deleted

## 2016-09-13 DIAGNOSIS — Z3A35 35 weeks gestation of pregnancy: Secondary | ICD-10-CM | POA: Diagnosis not present

## 2016-09-14 ENCOUNTER — Encounter (HOSPITAL_COMMUNITY): Payer: Self-pay

## 2016-09-21 DIAGNOSIS — Z3A36 36 weeks gestation of pregnancy: Secondary | ICD-10-CM | POA: Diagnosis not present

## 2016-09-21 DIAGNOSIS — O30043 Twin pregnancy, dichorionic/diamniotic, third trimester: Secondary | ICD-10-CM | POA: Diagnosis not present

## 2016-09-22 ENCOUNTER — Encounter (HOSPITAL_COMMUNITY)
Admission: RE | Admit: 2016-09-22 | Discharge: 2016-09-22 | Disposition: A | Discharge status: Home / Self Care | Payer: 59 | Source: Ambulatory Visit | Attending: Obstetrics and Gynecology | Admitting: Obstetrics and Gynecology

## 2016-09-22 DIAGNOSIS — O99214 Obesity complicating childbirth: Secondary | ICD-10-CM | POA: Diagnosis not present

## 2016-09-22 DIAGNOSIS — E669 Obesity, unspecified: Secondary | ICD-10-CM | POA: Diagnosis not present

## 2016-09-22 DIAGNOSIS — O134 Gestational [pregnancy-induced] hypertension without significant proteinuria, complicating childbirth: Secondary | ICD-10-CM | POA: Diagnosis not present

## 2016-09-22 DIAGNOSIS — O30043 Twin pregnancy, dichorionic/diamniotic, third trimester: Secondary | ICD-10-CM | POA: Diagnosis not present

## 2016-09-22 DIAGNOSIS — O3429 Maternal care due to uterine scar from other previous surgery: Secondary | ICD-10-CM | POA: Diagnosis not present

## 2016-09-22 DIAGNOSIS — O321XX2 Maternal care for breech presentation, fetus 2: Secondary | ICD-10-CM | POA: Diagnosis not present

## 2016-09-22 DIAGNOSIS — Z6830 Body mass index (BMI) 30.0-30.9, adult: Secondary | ICD-10-CM | POA: Diagnosis not present

## 2016-09-22 DIAGNOSIS — O9962 Diseases of the digestive system complicating childbirth: Secondary | ICD-10-CM | POA: Diagnosis not present

## 2016-09-22 HISTORY — DX: Essential (primary) hypertension: I10

## 2016-09-22 HISTORY — DX: Reserved for inherently not codable concepts without codable children: IMO0001

## 2016-09-22 LAB — CBC
HCT: 39.4 % (ref 36.0–46.0)
Hemoglobin: 14 g/dL (ref 12.0–15.0)
MCH: 34.5 pg — ABNORMAL HIGH (ref 26.0–34.0)
MCHC: 35.5 g/dL (ref 30.0–36.0)
MCV: 97 fL (ref 78.0–100.0)
Platelets: 168 10*3/uL (ref 150–400)
RBC: 4.06 MIL/uL (ref 3.87–5.11)
RDW: 14 % (ref 11.5–15.5)
WBC: 7.6 10*3/uL (ref 4.0–10.5)

## 2016-09-22 LAB — ABO/RH: ABO/RH(D): A POS

## 2016-09-22 NOTE — Patient Instructions (Signed)
20 NESA SZOSTEK  09/22/2016   Your procedure is scheduled on:  09/23/2016  Enter through the Maternity Admissions of [facility] [place] AM.  .   Call this number if you have problems the morning of surgery: [phone]   Remember:   Do not eat food:After Midnight.  Do not drink clear liquids: After Midnight.  Take these medicines the morning of surgery with A SIP OF WATER: none   Do not wear jewelry, make-up or nail polish.  Do not wear lotions, powders, or perfumes. Do not wear deodorant.  Do not shave 48 hours prior to surgery.  Do not bring valuables to the hospital.  [facility] is not   responsible for any belongings or valuables brought to the hospital.  Contacts, dentures or bridgework may not be worn into surgery.  Leave suitcase in the car. After surgery it may be brought to your room.  For patients admitted to the hospital, checkout time is 11:00 AM the day of              discharge.   Patients discharged the day of surgery will not be allowed to drive             home.  Name and phone number of your driver: na   Special Instructions:   N/A   Please read over the following fact sheets that you were given:   Surgical Site Infection Prevention

## 2016-09-23 ENCOUNTER — Inpatient Hospital Stay (HOSPITAL_COMMUNITY): Admission: RE | Admit: 2016-09-23 | Payer: 59 | Source: Ambulatory Visit | Admitting: Obstetrics and Gynecology

## 2016-09-23 ENCOUNTER — Inpatient Hospital Stay (HOSPITAL_COMMUNITY): Payer: 59 | Admitting: Anesthesiology

## 2016-09-23 ENCOUNTER — Encounter (HOSPITAL_COMMUNITY): Payer: Self-pay

## 2016-09-23 ENCOUNTER — Encounter (HOSPITAL_COMMUNITY)
Admission: AD | Disposition: A | Discharge status: Home / Self Care | Payer: Self-pay | Source: Ambulatory Visit | Attending: Obstetrics and Gynecology

## 2016-09-23 ENCOUNTER — Inpatient Hospital Stay (HOSPITAL_COMMUNITY)
Admission: AD | Admit: 2016-09-23 | Discharge: 2016-09-27 | DRG: 765 | Disposition: A | Discharge status: Home / Self Care | Payer: 59 | Source: Ambulatory Visit | Attending: Obstetrics and Gynecology | Admitting: Obstetrics and Gynecology

## 2016-09-23 DIAGNOSIS — O30043 Twin pregnancy, dichorionic/diamniotic, third trimester: Secondary | ICD-10-CM | POA: Diagnosis present

## 2016-09-23 DIAGNOSIS — O9962 Diseases of the digestive system complicating childbirth: Secondary | ICD-10-CM | POA: Diagnosis present

## 2016-09-23 DIAGNOSIS — K219 Gastro-esophageal reflux disease without esophagitis: Secondary | ICD-10-CM | POA: Diagnosis present

## 2016-09-23 DIAGNOSIS — Z6830 Body mass index (BMI) 30.0-30.9, adult: Secondary | ICD-10-CM | POA: Diagnosis not present

## 2016-09-23 DIAGNOSIS — O99214 Obesity complicating childbirth: Secondary | ICD-10-CM | POA: Diagnosis present

## 2016-09-23 DIAGNOSIS — O2293 Venous complication in pregnancy, unspecified, third trimester: Secondary | ICD-10-CM | POA: Diagnosis not present

## 2016-09-23 DIAGNOSIS — O321XX2 Maternal care for breech presentation, fetus 2: Secondary | ICD-10-CM | POA: Diagnosis not present

## 2016-09-23 DIAGNOSIS — O3429 Maternal care due to uterine scar from other previous surgery: Secondary | ICD-10-CM | POA: Diagnosis not present

## 2016-09-23 DIAGNOSIS — O134 Gestational [pregnancy-induced] hypertension without significant proteinuria, complicating childbirth: Secondary | ICD-10-CM | POA: Diagnosis present

## 2016-09-23 DIAGNOSIS — Z3A37 37 weeks gestation of pregnancy: Secondary | ICD-10-CM | POA: Diagnosis not present

## 2016-09-23 DIAGNOSIS — E669 Obesity, unspecified: Secondary | ICD-10-CM | POA: Diagnosis present

## 2016-09-23 DIAGNOSIS — O30003 Twin pregnancy, unspecified number of placenta and unspecified number of amniotic sacs, third trimester: Secondary | ICD-10-CM | POA: Diagnosis not present

## 2016-09-23 DIAGNOSIS — O1490 Unspecified pre-eclampsia, unspecified trimester: Secondary | ICD-10-CM | POA: Diagnosis present

## 2016-09-23 LAB — COMPREHENSIVE METABOLIC PANEL
ALT: 13 U/L — ABNORMAL LOW (ref 14–54)
ALT: 15 U/L (ref 14–54)
AST: 25 U/L (ref 15–41)
AST: 37 U/L (ref 15–41)
Albumin: 3.2 g/dL — ABNORMAL LOW (ref 3.5–5.0)
Albumin: 2.4 g/dL — ABNORMAL LOW (ref 3.5–5.0)
Alkaline Phosphatase: 153 U/L — ABNORMAL HIGH (ref 38–126)
Alkaline Phosphatase: 99 U/L (ref 38–126)
Anion gap: 8 (ref 5–15)
Anion gap: 6 (ref 5–15)
BUN: 10 mg/dL (ref 6–20)
BUN: 7 mg/dL (ref 6–20)
CO2: 23 mmol/L (ref 22–32)
CO2: 27 mmol/L (ref 22–32)
Calcium: 9.2 mg/dL (ref 8.9–10.3)
Calcium: 8.8 mg/dL — ABNORMAL LOW (ref 8.9–10.3)
Chloride: 105 mmol/L (ref 101–111)
Chloride: 101 mmol/L (ref 101–111)
Creatinine, Ser: 0.74 mg/dL (ref 0.44–1.00)
Creatinine, Ser: 0.79 mg/dL (ref 0.44–1.00)
GFR calc Af Amer: >60 mL/min (ref >60)
GFR calc Af Amer: >60 mL/min (ref >60)
GFR calc non Af Amer: >60 mL/min (ref >60)
GFR calc non Af Amer: >60 mL/min (ref >60)
Glucose, Bld: 74 mg/dL (ref 65–99)
Glucose, Bld: 134 mg/dL — ABNORMAL HIGH (ref 65–99)
Potassium: 4 mmol/L (ref 3.5–5.1)
Potassium: 4.2 mmol/L (ref 3.5–5.1)
Sodium: 136 mmol/L (ref 135–145)
Sodium: 134 mmol/L — ABNORMAL LOW (ref 135–145)
Total Bilirubin: 0.7 mg/dL (ref 0.3–1.2)
Total Bilirubin: 0.3 mg/dL (ref 0.3–1.2)
Total Protein: 7 g/dL (ref 6.5–8.1)
Total Protein: 4.9 g/dL — ABNORMAL LOW (ref 6.5–8.1)

## 2016-09-23 LAB — URIC ACID: Uric Acid, Serum: 5.5 mg/dL (ref 2.3–6.6)

## 2016-09-23 LAB — PROTEIN / CREATININE RATIO, URINE
Creatinine, Urine: 376 mg/dL
Creatinine, Urine: 137 mg/dL
Protein Creatinine Ratio: 0.18 mg/mg{Cre} — ABNORMAL HIGH (ref 0.00–0.15)
Protein Creatinine Ratio: 0.2 mg/mg{Cre} — ABNORMAL HIGH (ref 0.00–0.15)
Total Protein, Urine: 66 mg/dL
Total Protein, Urine: 27 mg/dL

## 2016-09-23 LAB — CBC
HCT: 40.3 % (ref 36.0–46.0)
HCT: 31.4 % — ABNORMAL LOW (ref 36.0–46.0)
Hemoglobin: 14.2 g/dL (ref 12.0–15.0)
Hemoglobin: 11.1 g/dL — ABNORMAL LOW (ref 12.0–15.0)
MCH: 34.4 pg — ABNORMAL HIGH (ref 26.0–34.0)
MCH: 34.3 pg — ABNORMAL HIGH (ref 26.0–34.0)
MCHC: 35.2 g/dL (ref 30.0–36.0)
MCHC: 35.7 g/dL (ref 30.0–36.0)
MCV: 97.6 fL (ref 78.0–100.0)
MCV: 96 fL (ref 78.0–100.0)
Platelets: 172 10*3/uL (ref 150–400)
Platelets: 138 10*3/uL — ABNORMAL LOW (ref 150–400)
RBC: 4.13 MIL/uL (ref 3.87–5.11)
RBC: 3.27 MIL/uL — ABNORMAL LOW (ref 3.87–5.11)
RDW: 13.8 % (ref 11.5–15.5)
RDW: 13.6 % (ref 11.5–15.5)
WBC: 7.6 10*3/uL (ref 4.0–10.5)
WBC: 8.7 10*3/uL (ref 4.0–10.5)

## 2016-09-23 LAB — PREPARE RBC (CROSSMATCH)

## 2016-09-23 LAB — RPR: RPR Ser Ql: NONREACTIVE

## 2016-09-23 SURGERY — Surgical Case
Anesthesia: Spinal | Wound class: Clean Contaminated

## 2016-09-23 MED ORDER — PHENYLEPHRINE 8 MG IN D5W 100 ML (0.08MG/ML) PREMIX OPTIME
INJECTION | INTRAVENOUS | Status: AC
  Filled 2016-09-23: qty 100

## 2016-09-23 MED ORDER — COCONUT OIL OIL
1.0000 "application " | TOPICAL_OIL | Status: DC | PRN
  Administered 2016-09-25: 1 via TOPICAL
  Filled 2016-09-23: qty 120

## 2016-09-23 MED ORDER — WITCH HAZEL-GLYCERIN EX PADS: 1.0000 "application " | MEDICATED_PAD | CUTANEOUS | Status: DC | PRN

## 2016-09-23 MED ORDER — IBUPROFEN 600 MG PO TABS: 600.0000 mg | ORAL_TABLET | Freq: Four times a day (QID) | ORAL | Status: DC | PRN

## 2016-09-23 MED ORDER — NALBUPHINE HCL 10 MG/ML IJ SOLN: 5.0000 mg | INTRAMUSCULAR | Status: DC | PRN

## 2016-09-23 MED ORDER — ZOLPIDEM TARTRATE 5 MG PO TABS: 5.0000 mg | ORAL_TABLET | Freq: Every evening | ORAL | Status: DC | PRN

## 2016-09-23 MED ORDER — LACTATED RINGERS IV SOLN
INTRAVENOUS | Status: DC | PRN
  Administered 2016-09-23: 08:00:00 via INTRAVENOUS

## 2016-09-23 MED ORDER — LACTATED RINGERS IV BOLUS (SEPSIS)
500.0000 mL | Freq: Once | INTRAVENOUS | Status: AC
  Administered 2016-09-23: 500 mL via INTRAVENOUS

## 2016-09-23 MED ORDER — NALBUPHINE HCL 10 MG/ML IJ SOLN: 5.0000 mg | Freq: Once | INTRAMUSCULAR | Status: DC | PRN

## 2016-09-23 MED ORDER — MORPHINE SULFATE-NACL 0.5-0.9 MG/ML-% IV SOSY
PREFILLED_SYRINGE | INTRAVENOUS | Status: AC
  Filled 2016-09-23: qty 1

## 2016-09-23 MED ORDER — SIMETHICONE 80 MG PO CHEW: 80.0000 mg | CHEWABLE_TABLET | ORAL | Status: DC | PRN

## 2016-09-23 MED ORDER — OXYCODONE-ACETAMINOPHEN 5-325 MG PO TABS
1.0000 | ORAL_TABLET | ORAL | Status: DC | PRN
  Administered 2016-09-25 (×2): 1 via ORAL
  Filled 2016-09-23 (×3): qty 1

## 2016-09-23 MED ORDER — PRENATAL MULTIVITAMIN CH
1.0000 | ORAL_TABLET | Freq: Every day | ORAL | Status: DC
  Administered 2016-09-24 – 2016-09-27 (×4): 1 via ORAL
  Filled 2016-09-23 (×4): qty 1

## 2016-09-23 MED ORDER — OXYTOCIN 10 UNIT/ML IJ SOLN
INTRAMUSCULAR | Status: AC
  Filled 2016-09-23: qty 4

## 2016-09-23 MED ORDER — CEFAZOLIN SODIUM-DEXTROSE 2-4 GM/100ML-% IV SOLN
2.0000 g | INTRAVENOUS | Status: AC
  Administered 2016-09-23: 2 g via INTRAVENOUS
  Filled 2016-09-23: qty 100

## 2016-09-23 MED ORDER — CEFAZOLIN SODIUM-DEXTROSE 2-4 GM/100ML-% IV SOLN
INTRAVENOUS | Status: AC
  Filled 2016-09-23: qty 100

## 2016-09-23 MED ORDER — FENTANYL CITRATE (PF) 100 MCG/2ML IJ SOLN: 25.0000 ug | INTRAMUSCULAR | Status: DC | PRN

## 2016-09-23 MED ORDER — LACTATED RINGERS IV SOLN
INTRAVENOUS | Status: DC | PRN
  Administered 2016-09-23 (×2): via INTRAVENOUS

## 2016-09-23 MED ORDER — SODIUM CHLORIDE 0.9% FLUSH: 3.0000 mL | INTRAVENOUS | Status: DC | PRN

## 2016-09-23 MED ORDER — ONDANSETRON HCL 4 MG/2ML IJ SOLN
INTRAMUSCULAR | Status: AC
  Filled 2016-09-23: qty 2

## 2016-09-23 MED ORDER — DIPHENHYDRAMINE HCL 25 MG PO CAPS: 25.0000 mg | ORAL_CAPSULE | Freq: Four times a day (QID) | ORAL | Status: DC | PRN

## 2016-09-23 MED ORDER — MENTHOL 3 MG MT LOZG
1.0000 | LOZENGE | OROMUCOSAL | Status: DC | PRN
Start: 2016-09-23 — End: 2016-09-27

## 2016-09-23 MED ORDER — SOD CITRATE-CITRIC ACID 500-334 MG/5ML PO SOLN
ORAL | Status: AC
  Administered 2016-09-23: 30 mL
  Filled 2016-09-23: qty 15

## 2016-09-23 MED ORDER — HYDRALAZINE HCL 20 MG/ML IJ SOLN
5.0000 mg | Freq: Once | INTRAMUSCULAR | Status: AC
  Administered 2016-09-23: 5 mg via INTRAVENOUS
  Filled 2016-09-23: qty 1

## 2016-09-23 MED ORDER — NALOXONE HCL 2 MG/2ML IJ SOSY
1.0000 ug/kg/h | PREFILLED_SYRINGE | INTRAVENOUS | Status: DC | PRN
  Filled 2016-09-23: qty 2

## 2016-09-23 MED ORDER — EPHEDRINE SULFATE 50 MG/ML IJ SOLN
INTRAMUSCULAR | Status: DC | PRN
  Administered 2016-09-23: 10 mg via INTRAVENOUS

## 2016-09-23 MED ORDER — LABETALOL HCL 5 MG/ML IV SOLN
5.0000 mg | Freq: Once | INTRAVENOUS | Status: AC
  Administered 2016-09-23: 5 mg via INTRAVENOUS

## 2016-09-23 MED ORDER — KETOROLAC TROMETHAMINE 30 MG/ML IJ SOLN: 30.0000 mg | Freq: Four times a day (QID) | INTRAMUSCULAR | Status: AC | PRN

## 2016-09-23 MED ORDER — FENTANYL CITRATE (PF) 100 MCG/2ML IJ SOLN
INTRAMUSCULAR | Status: AC
  Filled 2016-09-23: qty 2

## 2016-09-23 MED ORDER — NALOXONE HCL 0.4 MG/ML IJ SOLN: 0.4000 mg | INTRAMUSCULAR | Status: DC | PRN

## 2016-09-23 MED ORDER — BUPIVACAINE HCL (PF) 0.25 % IJ SOLN
INTRAMUSCULAR | Status: DC | PRN
  Administered 2016-09-23: 8 mL

## 2016-09-23 MED ORDER — OXYTOCIN 40 UNITS IN LACTATED RINGERS INFUSION - SIMPLE MED
INTRAVENOUS | Status: AC
  Administered 2016-09-23: 10:00:00
  Filled 2016-09-23: qty 1000

## 2016-09-23 MED ORDER — SIMETHICONE 80 MG PO CHEW
80.0000 mg | CHEWABLE_TABLET | Freq: Three times a day (TID) | ORAL | Status: DC
  Administered 2016-09-24 (×3): 80 mg via ORAL
  Filled 2016-09-23 (×7): qty 1

## 2016-09-23 MED ORDER — LACTATED RINGERS IV SOLN
INTRAVENOUS | Status: DC
Start: 2016-09-23 — End: 2016-09-27
  Administered 2016-09-23 – 2016-09-24 (×3): via INTRAVENOUS

## 2016-09-23 MED ORDER — SIMETHICONE 80 MG PO CHEW
80.0000 mg | CHEWABLE_TABLET | ORAL | Status: DC
  Administered 2016-09-26: 80 mg via ORAL
  Filled 2016-09-23 (×2): qty 1

## 2016-09-23 MED ORDER — PHENYLEPHRINE 8 MG IN D5W 100 ML (0.08MG/ML) PREMIX OPTIME
INJECTION | INTRAVENOUS | Status: DC | PRN
  Administered 2016-09-23: 20 ug/min via INTRAVENOUS

## 2016-09-23 MED ORDER — DIBUCAINE 1 % RE OINT: 1.0000 "application " | TOPICAL_OINTMENT | RECTAL | Status: DC | PRN

## 2016-09-23 MED ORDER — ONDANSETRON HCL 4 MG/2ML IJ SOLN
INTRAMUSCULAR | Status: DC | PRN
  Administered 2016-09-23: 4 mg via INTRAVENOUS

## 2016-09-23 MED ORDER — DIPHENHYDRAMINE HCL 25 MG PO CAPS: 25.0000 mg | ORAL_CAPSULE | ORAL | Status: DC | PRN

## 2016-09-23 MED ORDER — IBUPROFEN 600 MG PO TABS
600.0000 mg | ORAL_TABLET | Freq: Four times a day (QID) | ORAL | Status: DC
Start: 2016-09-23 — End: 2016-09-27
  Administered 2016-09-24 – 2016-09-27 (×14): 600 mg via ORAL
  Filled 2016-09-23 (×15): qty 1

## 2016-09-23 MED ORDER — MORPHINE SULFATE (PF) 0.5 MG/ML IJ SOLN
INTRAMUSCULAR | Status: DC | PRN
  Administered 2016-09-23: .2 mg via INTRATHECAL

## 2016-09-23 MED ORDER — KETOROLAC TROMETHAMINE 30 MG/ML IJ SOLN
30.0000 mg | Freq: Four times a day (QID) | INTRAMUSCULAR | Status: AC | PRN
  Administered 2016-09-23: 30 mg via INTRAMUSCULAR

## 2016-09-23 MED ORDER — ACETAMINOPHEN 500 MG PO TABS
1000.0000 mg | ORAL_TABLET | Freq: Four times a day (QID) | ORAL | Status: AC
  Administered 2016-09-24: 1000 mg via ORAL
  Filled 2016-09-23 (×2): qty 2

## 2016-09-23 MED ORDER — OXYCODONE-ACETAMINOPHEN 5-325 MG PO TABS
2.0000 | ORAL_TABLET | ORAL | Status: DC | PRN
Start: 2016-09-23 — End: 2016-09-27

## 2016-09-23 MED ORDER — DIPHENHYDRAMINE HCL 50 MG/ML IJ SOLN: 12.5000 mg | INTRAMUSCULAR | Status: DC | PRN

## 2016-09-23 MED ORDER — OXYTOCIN 10 UNIT/ML IJ SOLN
INTRAVENOUS | Status: DC | PRN
  Administered 2016-09-23: 40 [IU] via INTRAVENOUS

## 2016-09-23 MED ORDER — BUPIVACAINE IN DEXTROSE 0.75-8.25 % IT SOLN
INTRATHECAL | Status: DC | PRN
  Administered 2016-09-23: 1.6 mL via INTRATHECAL

## 2016-09-23 MED ORDER — LABETALOL HCL 5 MG/ML IV SOLN
INTRAVENOUS | Status: AC
  Filled 2016-09-23: qty 4

## 2016-09-23 MED ORDER — ONDANSETRON HCL 4 MG/2ML IJ SOLN
4.0000 mg | Freq: Three times a day (TID) | INTRAMUSCULAR | Status: DC | PRN
  Administered 2016-09-23: 4 mg via INTRAVENOUS
  Filled 2016-09-23: qty 2

## 2016-09-23 MED ORDER — FENTANYL CITRATE (PF) 100 MCG/2ML IJ SOLN
INTRAMUSCULAR | Status: DC | PRN
  Administered 2016-09-23: 20 ug via INTRATHECAL

## 2016-09-23 MED ORDER — BUPIVACAINE HCL (PF) 0.25 % IJ SOLN
INTRAMUSCULAR | Status: AC
  Filled 2016-09-23: qty 30

## 2016-09-23 MED ORDER — SENNOSIDES-DOCUSATE SODIUM 8.6-50 MG PO TABS
2.0000 | ORAL_TABLET | ORAL | Status: DC
  Administered 2016-09-25 – 2016-09-26 (×2): 2 via ORAL
  Filled 2016-09-23 (×4): qty 2

## 2016-09-23 MED ORDER — LABETALOL HCL 100 MG PO TABS
100.0000 mg | ORAL_TABLET | Freq: Two times a day (BID) | ORAL | Status: DC
  Administered 2016-09-23 – 2016-09-24 (×2): 100 mg via ORAL
  Filled 2016-09-23 (×2): qty 1

## 2016-09-23 MED ORDER — MEPERIDINE HCL 25 MG/ML IJ SOLN: 6.2500 mg | INTRAMUSCULAR | Status: DC | PRN

## 2016-09-23 MED ORDER — OXYTOCIN 40 UNITS IN LACTATED RINGERS INFUSION - SIMPLE MED: 2.5000 [IU]/h | INTRAVENOUS | Status: AC

## 2016-09-23 SURGICAL SUPPLY — 34 items
BARRIER ADHS 3X4 INTERCEED (GAUZE/BANDAGES/DRESSINGS) ×4 IMPLANT
BENZOIN TINCTURE PRP APPL 2/3 (GAUZE/BANDAGES/DRESSINGS) ×4 IMPLANT
CHLORAPREP W/TINT 26ML (MISCELLANEOUS) ×2 IMPLANT
CLAMP CORD UMBIL (MISCELLANEOUS) ×2 IMPLANT
CLOTH BEACON ORANGE TIMEOUT ST (SAFETY) ×2 IMPLANT
DRAPE C SECTION CLR SCREEN (DRAPES) ×2 IMPLANT
DRSG OPSITE POSTOP 4X10 (GAUZE/BANDAGES/DRESSINGS) ×4 IMPLANT
ELECT REM PT RETURN 9FT ADLT (ELECTROSURGICAL) ×2
ELECTRODE REM PT RTRN 9FT ADLT (ELECTROSURGICAL) ×1 IMPLANT
EXTRACTOR VACUUM M CUP 4 TUBE (SUCTIONS) ×2 IMPLANT
GLOVE BIOGEL PI IND STRL 7.0 (GLOVE) ×1 IMPLANT
GLOVE BIOGEL PI INDICATOR 7.0 (GLOVE) ×1
GLOVE ECLIPSE 6.5 STRL STRAW (GLOVE) ×2 IMPLANT
GOWN STRL REUS W/TWL LRG LVL3 (GOWN DISPOSABLE) ×4 IMPLANT
KIT ABG SYR 3ML LUER SLIP (SYRINGE) ×2 IMPLANT
NEEDLE HYPO 22GX1.5 SAFETY (NEEDLE) ×2 IMPLANT
NEEDLE HYPO 25X5/8 SAFETYGLIDE (NEEDLE) ×2 IMPLANT
NS IRRIG 1000ML POUR BTL (IV SOLUTION) ×2 IMPLANT
PACK C SECTION WH (CUSTOM PROCEDURE TRAY) ×2 IMPLANT
PAD OB MATERNITY 4.3X12.25 (PERSONAL CARE ITEMS) ×2 IMPLANT
RTRCTR C-SECT PINK 25CM LRG (MISCELLANEOUS) ×2 IMPLANT
STRIP CLOSURE SKIN 1/2X4 (GAUZE/BANDAGES/DRESSINGS) ×14 IMPLANT
SUT MNCRL 0 VIOLET CTX 36 (SUTURE) ×3 IMPLANT
SUT MON AB 3-0 SH 27 (SUTURE) ×1
SUT MON AB 3-0 SH27 (SUTURE) ×1 IMPLANT
SUT MON AB 4-0 PS1 27 (SUTURE) ×2 IMPLANT
SUT MONOCRYL 0 CTX 36 (SUTURE) ×3
SUT PLAIN 2 0 XLH (SUTURE) ×2 IMPLANT
SUT VIC AB 0 CT1 36 (SUTURE) ×4 IMPLANT
SUT VIC AB 2-0 CT1 27 (SUTURE) ×2
SUT VIC AB 2-0 CT1 TAPERPNT 27 (SUTURE) ×2 IMPLANT
SYR CONTROL 10ML LL (SYRINGE) ×2 IMPLANT
TOWEL OR 17X24 6PK STRL BLUE (TOWEL DISPOSABLE) ×2 IMPLANT
TRAY FOLEY CATH SILVER 14FR (SET/KITS/TRAYS/PACK) ×2 IMPLANT

## 2016-09-23 NOTE — Anesthesia Preprocedure Evaluation (Signed)
Anesthesia Evaluation  Patient identified by MRN, date of birth, ID band Patient awake    Reviewed: Allergy & Precautions, NPO status , Patient's Chart, lab work & pertinent test results  Airway Mallampati: III  TM Distance: >3 FB Neck ROM: Full    Dental no notable dental hx. (+) Teeth Intact   Pulmonary neg pulmonary ROS,    Pulmonary exam normal breath sounds clear to auscultation       Cardiovascular negative cardio ROS Normal cardiovascular exam Rhythm:Regular Rate:Normal     Neuro/Psych negative neurological ROS  negative psych ROS   GI/Hepatic Neg liver ROS, GERD  Medicated and Controlled,  Endo/Other  Obesity  Renal/GU negative Renal ROS  negative genitourinary   Musculoskeletal   Abdominal (+) + obese,   Peds  Hematology negative hematology ROS (+)   Anesthesia Other Findings   Reproductive/Obstetrics (+) Pregnancy Twin gestation Hx/o Myomectomy                             Lab Results  Component Value Date   WBC 7.6 09/23/2016   HGB 14.2 09/23/2016   HCT 40.3 09/23/2016   MCV 97.6 09/23/2016   PLT 172 09/23/2016     Chemistry      Component Value Date/Time   NA 136 09/23/2016 0550   K 4.0 09/23/2016 0550   CL 105 09/23/2016 0550   CO2 23 09/23/2016 0550   BUN 10 09/23/2016 0550   CREATININE 0.74 09/23/2016 0550      Component Value Date/Time   CALCIUM 9.2 09/23/2016 0550   ALKPHOS 153 (H) 09/23/2016 0550   AST 25 09/23/2016 0550   ALT 13 (L) 09/23/2016 0550   BILITOT 0.7 09/23/2016 0550      Anesthesia Physical Anesthesia Plan  ASA: II  Anesthesia Plan: Spinal   Post-op Pain Management:    Induction:   Airway Management Planned: Natural Airway  Additional Equipment:   Intra-op Plan:   Post-operative Plan:   Informed Consent: I have reviewed the patients History and Physical, chart, labs and discussed the procedure including the risks, benefits  and alternatives for the proposed anesthesia with the patient or authorized representative who has indicated his/her understanding and acceptance.   Dental advisory given  Plan Discussed with: CRNA, Anesthesiologist and Surgeon  Anesthesia Plan Comments:         Anesthesia Quick Evaluation

## 2016-09-23 NOTE — Anesthesia Procedure Notes (Signed)
Spinal  Patient location during procedure: OR Start time: 09/23/2016 7:30 AM Staffing Anesthesiologist: Josephine Igo Performed: anesthesiologist  Preanesthetic Checklist Completed: patient identified, site marked, surgical consent, pre-op evaluation, timeout performed, IV checked, risks and benefits discussed and monitors and equipment checked Spinal Block Patient position: sitting Prep: site prepped and draped and DuraPrep Patient monitoring: heart rate, cardiac monitor, continuous pulse ox and blood pressure Approach: midline Location: L3-4 Injection technique: single-shot Needle Needle type: Pencan  Needle gauge: 24 G Needle length: 9 cm Needle insertion depth: 4.5 cm Assessment Sensory level: T4 Additional Notes Patient tolerated procedure well. Adequate sensory level.

## 2016-09-23 NOTE — Anesthesia Postprocedure Evaluation (Signed)
Anesthesia Post Note  Patient: [name]  Procedure(s) Performed: Procedure(s) (LRB): Primary CESAREAN SECTION MULTI-GESTATIONAL (N/A)  Patient location during evaluation: PACU Anesthesia Type: Spinal Level of consciousness: awake and alert and oriented Pain management: pain level controlled Vital Signs Assessment: post-procedure vital signs reviewed and stable Respiratory status: spontaneous breathing, nonlabored ventilation and respiratory function stable Cardiovascular status: blood pressure returned to baseline and stable Postop Assessment: no signs of nausea or vomiting, spinal receding, no backache, no headache and patient able to bend at knees Anesthetic complications: no        Last Vitals:  Vitals:   09/23/16 0908 09/23/16 0909  BP:    Pulse: 76 78  Resp: 18 18  Temp:      Last Pain:  Vitals:   09/23/16 0901  TempSrc:   PainSc: 0-No pain   Pain Goal:                 Bracha Frankowski A.

## 2016-09-23 NOTE — Transfer of Care (Signed)
Immediate Anesthesia Transfer of Care Note  Patient: [name]  Procedure(s) Performed: Procedure(s) with comments: Primary CESAREAN SECTION MULTI-GESTATIONAL (N/A) - EDD: 10/14/16 Allergy: Doxycycline  Patient Location: PACU  Anesthesia Type:Spinal  Level of Consciousness: awake and alert   Airway & Oxygen Therapy: Patient Spontanous Breathing  Post-op Assessment: Report given to RN and Post -op Vital signs reviewed and stable  Post vital signs: Reviewed  Last Vitals:  Vitals:   09/23/16 0640 09/23/16 0652  BP: (!) 146/104 (!) 158/109  Pulse: 89 88  Resp: 18 18  Temp:  36.8 C    Last Pain:  Vitals:   09/23/16 0652  TempSrc: Oral  PainSc:          Complications: No apparent anesthesia complications

## 2016-09-23 NOTE — Lactation Note (Addendum)
This note was copied from a baby's chart. Lactation Consultation Note  Patient Name: Nalisha Fulfer [id number] Date: 09/23/2016 Reason for consult: Initial assessment;Multiple gestation  Twins, [redacted]w[redacted]d GA. Mom reports that baby Boy "A" has not really latched since birth. However, mom has made many attempts and has been offering STS. Mom reports that baby Boy "B" has latched, but has also been sleepy at the breast. Mom attempted to latch both babies simultaneously in the football position, but babies sleepy at this feeding. Assisted mom with hand expression and spoon-fed each baby .5 ml of EBM. Both babies tolerated spoon-feeding well. Reviewed LPI sheet with mom, and assisted mom to start pumping with DEBP. Plan is for mom to offer lots of STS and nurse babies with cues. Enc mom to supplement according to guidelines--which were reviewed. Enc mom to post-pump followed by hand expression. 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 Mom informed of the benefits of a hospital-grade DEBP as well.   Mom enc to call for assistance and patient's bedside RN, Butch Penny, aware of assessment and interventions.  Maternal Data    Feeding Feeding Type: Breast Milk Length of feed: 0 min  LATCH Score/Interventions                      Lactation Tools Discussed/Used     Consult Status      Andres Labrum 09/23/2016, 6:31 PM

## 2016-09-23 NOTE — Progress Notes (Addendum)
   09/23/16 2130  Vital Signs  BP (!) 153/102  BP Location Left Arm  Patient Position (if appropriate) Semi-fowlers  BP Method Automatic  Pulse Rate 70  Pulse Rate Source Monitor  Resp 18  Temp 98.1 F (36.7 C)  Temp Source Oral  POSS Scale ([place] Opioid Sedation Scale)  POSS *See Group Information* 1-Acceptable,Awake and alert  Oxygen Therapy  SpO2 100 %  O2 Device Room Air  Dr. Dellis Filbert, OB on call notified.  Pt asymptomatic, absent clonus, 2+ reflexes, denies blurry vision and HA.  See new orders in chart.  Per MD, call if labs are abnormal.

## 2016-09-23 NOTE — Brief Op Note (Signed)
09/23/2016  8:46 AM  PATIENT:  [name]  35 y.o. female  PRE-OPERATIVE DIAGNOSIS:  Previous Myomectomy, Malpresentation of Second Twin, IUP@ 37 weeks( di-di twins)  POST-OPERATIVE DIAGNOSIS:  Previous Myomectomy, Malpresentation of Second Twin, IUP ( di-di twins) @ 53 weeks  PROCEDURE:  Primary Cesarean section, kerr hysterotomy  SURGEON:  Surgeon(s) and Role:    * Servando Salina, MD - Primary  PHYSICIAN ASSISTANT:   ASSISTANTS: none   FINDINGS: LIVE FEMALE X 2( VTX/BREECH), NL TUBES AND OVARIES. APGARS 9/10 X 2, POSTERIOR PLACENTA X 2 ANESTHESIA:   spinal  EBL:  Total I/O In: 1600 [I.V.:1600] Out: 1030 [Urine:30; Blood:1000]  BLOOD ADMINISTERED:none  DRAINS: none   LOCAL MEDICATIONS USED:  MARCAINE     SPECIMEN:  Source of Specimen:  placenta x 2  DISPOSITION OF SPECIMEN:  PATHOLOGY  COUNTS:  YES  TOURNIQUET:  * No tourniquets in log *  DICTATION: .Other Dictation: Dictation Number [id number]  PLAN OF CARE: Admit to inpatient   PATIENT DISPOSITION:  PACU - hemodynamically stable.   Delay start of Pharmacological VTE agent (>24hrs) due to surgical blood loss or risk of bleeding: no

## 2016-09-23 NOTE — Progress Notes (Signed)
Temp recheck did not register oral or axillary. Pt's upper body has been exposed breastfeeding both babies individually (placing and removing babies). Lower body feels very warm to touch. Blanket added to keep upper body warm, bear hugger temp increased. If unable to register temp on next eval, will have another RN help position pt for rectal temp.Dr cousins has been as bedside, evaluated BP and urine output-order received for IV bolus.

## 2016-09-24 LAB — CBC
HCT: 30.5 % — ABNORMAL LOW (ref 36.0–46.0)
HCT: 32.4 % — ABNORMAL LOW (ref 36.0–46.0)
Hemoglobin: 10.8 g/dL — ABNORMAL LOW (ref 12.0–15.0)
Hemoglobin: 11.5 g/dL — ABNORMAL LOW (ref 12.0–15.0)
MCH: 34 pg (ref 26.0–34.0)
MCH: 34.1 pg — ABNORMAL HIGH (ref 26.0–34.0)
MCHC: 35.4 g/dL (ref 30.0–36.0)
MCHC: 35.5 g/dL (ref 30.0–36.0)
MCV: 95.9 fL (ref 78.0–100.0)
MCV: 96.1 fL (ref 78.0–100.0)
Platelets: 126 10*3/uL — ABNORMAL LOW (ref 150–400)
Platelets: 145 10*3/uL — ABNORMAL LOW (ref 150–400)
RBC: 3.18 MIL/uL — ABNORMAL LOW (ref 3.87–5.11)
RBC: 3.37 MIL/uL — ABNORMAL LOW (ref 3.87–5.11)
RDW: 13.7 % (ref 11.5–15.5)
RDW: 13.8 % (ref 11.5–15.5)
WBC: 7.9 10*3/uL (ref 4.0–10.5)
WBC: 8.2 10*3/uL (ref 4.0–10.5)

## 2016-09-24 LAB — COMPREHENSIVE METABOLIC PANEL
ALT: 16 U/L (ref 14–54)
ALT: 17 U/L (ref 14–54)
AST: 36 U/L (ref 15–41)
AST: 40 U/L (ref 15–41)
Albumin: 2.3 g/dL — ABNORMAL LOW (ref 3.5–5.0)
Albumin: 2.5 g/dL — ABNORMAL LOW (ref 3.5–5.0)
Alkaline Phosphatase: 94 U/L (ref 38–126)
Alkaline Phosphatase: 88 U/L (ref 38–126)
Anion gap: 6 (ref 5–15)
Anion gap: 10 (ref 5–15)
BUN: 6 mg/dL (ref 6–20)
BUN: 6 mg/dL (ref 6–20)
CO2: 27 mmol/L (ref 22–32)
CO2: 25 mmol/L (ref 22–32)
Calcium: 8.5 mg/dL — ABNORMAL LOW (ref 8.9–10.3)
Calcium: 8.6 mg/dL — ABNORMAL LOW (ref 8.9–10.3)
Chloride: 103 mmol/L (ref 101–111)
Chloride: 104 mmol/L (ref 101–111)
Creatinine, Ser: 0.79 mg/dL (ref 0.44–1.00)
Creatinine, Ser: 0.79 mg/dL (ref 0.44–1.00)
GFR calc Af Amer: >60 mL/min (ref >60)
GFR calc Af Amer: >60 mL/min (ref >60)
GFR calc non Af Amer: >60 mL/min (ref >60)
GFR calc non Af Amer: >60 mL/min (ref >60)
Glucose, Bld: 82 mg/dL (ref 65–99)
Glucose, Bld: 105 mg/dL — ABNORMAL HIGH (ref 65–99)
Potassium: 3.8 mmol/L (ref 3.5–5.1)
Potassium: 3.6 mmol/L (ref 3.5–5.1)
Sodium: 136 mmol/L (ref 135–145)
Sodium: 139 mmol/L (ref 135–145)
Total Bilirubin: 0.1 mg/dL — ABNORMAL LOW (ref 0.3–1.2)
Total Bilirubin: 0.4 mg/dL (ref 0.3–1.2)
Total Protein: 5 g/dL — ABNORMAL LOW (ref 6.5–8.1)
Total Protein: 5.3 g/dL — ABNORMAL LOW (ref 6.5–8.1)

## 2016-09-24 LAB — PROTEIN / CREATININE RATIO, URINE
Creatinine, Urine: 77 mg/dL
Total Protein, Urine: <6 mg/dL

## 2016-09-24 LAB — URIC ACID: Uric Acid, Serum: 5 mg/dL (ref 2.3–6.6)

## 2016-09-24 MED ORDER — LABETALOL HCL 100 MG PO TABS
100.0000 mg | ORAL_TABLET | Freq: Three times a day (TID) | ORAL | Status: DC
  Administered 2016-09-24 (×2): 100 mg via ORAL
  Filled 2016-09-24 (×2): qty 1

## 2016-09-24 NOTE — Progress Notes (Signed)
Catheter left in due to pending [place] lab work @ 1000 this morning. House coverage contacted and agrees to leave catheter in to decrease infection.

## 2016-09-24 NOTE — Lactation Note (Signed)
This note was copied from a baby's chart. Lactation Consultation Note  Patient Name: [name] [id number] Date: 09/24/2016 Reason for consult: Follow-up assessment Babies at 29 hr of life. Upon entry mom was trying to bf Baby A. She stated he ate well at 5am but has not eaten since. Baby is cueing, but is doing a lot of lip/tongue sucking and has a tight jaw. Applied #24 NS that looks slight small, mom reported it felt "better" when baby sucked but it was a challenge to get baby to bf with it. Mom has large nipples. Showed Mom how to do jaw massage and tummy time to help stretch baby because he likes to lay in a "C" shaped position with his head to the left. After 10 minutes of trying to bf mom was agreeable to offering formula. Assisted mom and MGM in finger feeding. Baby took 32ml and tolerated the feeding well.  Baby B was sleeping in the basinet for this visit. Mom and RN reported that he has been bf well.  Mom will continue to offer the breast on demand q3hr, post pump, and supplement per LPT infant volume guidelines. 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 She requested and recieved the Medela MetroTote. She is aware of lactation services and support group. She will call as needed.   Maternal Data Has patient been taught Hand Expression?: Yes Does the patient have breastfeeding experience prior to this delivery?: No  Feeding Feeding Type: Breast Fed Length of feed: 5 min  LATCH Score/Interventions Latch: Repeated attempts needed to sustain latch, nipple held in mouth throughout feeding, stimulation needed to elicit sucking reflex. Intervention(s): Waking techniques Intervention(s): Assist with latch;Breast compression  Audible Swallowing: None  Type of Nipple: Everted at rest and after stimulation  Comfort (Breast/Nipple): Soft / non-tender     Hold (Positioning): Full assist, staff holds infant at breast Intervention(s): Position options  LATCH Score:  5  Lactation Tools Discussed/Used Tools: Nipple Shields Nipple shield size: 24 Pump Review: Setup, frequency, and cleaning;Milk Storage Initiated by:: ES Date initiated:: 09/24/16   Consult Status Consult Status: Follow-up Date: 09/25/16 Follow-up type: In-patient    Denzil Hughes 09/24/2016, 1:46 PM

## 2016-09-24 NOTE — Progress Notes (Signed)
   09/24/16 0035  Vital Signs  BP 138/85  BP Location Left Arm  Patient Position (if appropriate) Lying  BP Method Automatic  Pulse Rate 71  Pulse Rate Source Monitor  Resp 18  Late entry due to pt care.  MD notified of lab results and above findings. Pt asymptomatic, absent clonus, 2+ reflexes, denies blurry vision and HA.  Repeat [place] labs @ 1000 per MD.

## 2016-09-24 NOTE — Progress Notes (Signed)
Patient ID: [name], female   DOB: February 27, 1981, 35 y.o.   MRN: [id number] Subjective: POD# 1 Information for the patient's newborn:  Charysse, Iseri [id number]  female Information for the patient's newborn:  Kely, Mager [id number]  female  / circ planning x 2  Reports feeling well. Feeding: breast Patient reports tolerating PO.  Breast symptoms: (+) colostrum / "nursing well, baby B nurses more" Pain controlled with ibuprofen (OTC) Denies HA/SOB/C/P/N/V/dizziness. Flatus present. No BM. She reports vaginal bleeding as normal, without clots.  She is ambulating, urinating without difficult.     Objective:   VS:  Vitals:   09/23/16 2252 09/24/16 0035 09/24/16 0515 09/24/16 0937  BP: (!) 150/92 138/85 139/74 (!) 148/99  Pulse: 75 71 77 91  Resp: 18 18 18 20   Temp:   98 F (36.7 C) 97.4 F (36.3 C)  TempSrc:   Oral Axillary  SpO2: 97%   99%  Weight:      Height:         Intake/Output Summary (Last 24 hours) at 09/24/16 1030 Last data filed at 09/24/16 0110  Gross per 24 hour  Intake              240 ml  Output             1375 ml  Net            -1135 ml        Recent Labs  09/24/16 0550 09/24/16 1005  WBC 7.9 8.2  HGB 10.8* 11.5*  HCT 30.5* 32.4*  PLT 126* 145*   Results for orders placed or performed during the hospital encounter of 09/23/16 (from the past 24 hour(s))  CBC     Status: Abnormal   Collection Time: 09/23/16 10:34 PM  Result Value Ref Range   WBC 8.7 4.0 - 10.5 K/uL   RBC 3.27 (L) 3.87 - 5.11 MIL/uL   Hemoglobin 11.1 (L) 12.0 - 15.0 g/dL   HCT 31.4 (L) 36.0 - 46.0 %   MCV 96.0 78.0 - 100.0 fL   MCH 34.3 (H) 26.0 - 34.0 pg   MCHC 35.7 30.0 - 36.0 g/dL   RDW 13.6 11.5 - 15.5 %   Platelets 138 (L) 150 - 400 K/uL  Comprehensive metabolic panel     Status: Abnormal   Collection Time: 09/23/16 10:34 PM  Result Value Ref Range   Sodium 134 (L) 135 - 145 mmol/L   Potassium 4.2 3.5 - 5.1 mmol/L   Chloride 101 101 - 111 mmol/L   CO2 27  22 - 32 mmol/L   Glucose, Bld 134 (H) 65 - 99 mg/dL   BUN 7 6 - 20 mg/dL   Creatinine, Ser 0.79 0.44 - 1.00 mg/dL   Calcium 8.8 (L) 8.9 - 10.3 mg/dL   Total Protein 4.9 (L) 6.5 - 8.1 g/dL   Albumin 2.4 (L) 3.5 - 5.0 g/dL   AST 37 15 - 41 U/L   ALT 15 14 - 54 U/L   Alkaline Phosphatase 99 38 - 126 U/L   Total Bilirubin 0.3 0.3 - 1.2 mg/dL   GFR calc non Af Amer >60 >60 mL/min   GFR calc Af Amer >60 >60 mL/min   Anion gap 6 5 - 15  Protein / creatinine ratio, urine     Status: Abnormal   Collection Time: 09/23/16 10:45 PM  Result Value Ref Range   Creatinine, Urine 137.00 mg/dL   Total Protein, Urine 27 mg/dL  Protein Creatinine Ratio 0.20 (H) 0.00 - 0.15 mg/mg[Cre]  Uric acid     Status: None   Collection Time: 09/24/16  5:50 AM  Result Value Ref Range   Uric Acid, Serum 5.0 2.3 - 6.6 mg/dL  CBC     Status: Abnormal   Collection Time: 09/24/16  5:50 AM  Result Value Ref Range   WBC 7.9 4.0 - 10.5 K/uL   RBC 3.18 (L) 3.87 - 5.11 MIL/uL   Hemoglobin 10.8 (L) 12.0 - 15.0 g/dL   HCT 30.5 (L) 36.0 - 46.0 %   MCV 95.9 78.0 - 100.0 fL   MCH 34.0 26.0 - 34.0 pg   MCHC 35.4 30.0 - 36.0 g/dL   RDW 13.7 11.5 - 15.5 %   Platelets 126 (L) 150 - 400 K/uL  Comprehensive metabolic panel     Status: Abnormal   Collection Time: 09/24/16  5:50 AM  Result Value Ref Range   Sodium 136 135 - 145 mmol/L   Potassium 3.8 3.5 - 5.1 mmol/L   Chloride 103 101 - 111 mmol/L   CO2 27 22 - 32 mmol/L   Glucose, Bld 82 65 - 99 mg/dL   BUN 6 6 - 20 mg/dL   Creatinine, Ser 0.79 0.44 - 1.00 mg/dL   Calcium 8.5 (L) 8.9 - 10.3 mg/dL   Total Protein 5.0 (L) 6.5 - 8.1 g/dL   Albumin 2.3 (L) 3.5 - 5.0 g/dL   AST 36 15 - 41 U/L   ALT 16 14 - 54 U/L   Alkaline Phosphatase 94 38 - 126 U/L   Total Bilirubin 0.1 (L) 0.3 - 1.2 mg/dL   GFR calc non Af Amer >60 >60 mL/min   GFR calc Af Amer >60 >60 mL/min   Anion gap 6 5 - 15  Protein / creatinine ratio, urine     Status: None   Collection Time: 09/24/16  10:02 AM  Result Value Ref Range   Creatinine, Urine 77.00 mg/dL   Total Protein, Urine <6 mg/dL   Protein Creatinine Ratio        0.00 - 0.15 mg/mg[Cre]  CBC     Status: Abnormal   Collection Time: 09/24/16 10:05 AM  Result Value Ref Range   WBC 8.2 4.0 - 10.5 K/uL   RBC 3.37 (L) 3.87 - 5.11 MIL/uL   Hemoglobin 11.5 (L) 12.0 - 15.0 g/dL   HCT 32.4 (L) 36.0 - 46.0 %   MCV 96.1 78.0 - 100.0 fL   MCH 34.1 (H) 26.0 - 34.0 pg   MCHC 35.5 30.0 - 36.0 g/dL   RDW 13.8 11.5 - 15.5 %   Platelets 145 (L) 150 - 400 K/uL  Comprehensive metabolic panel     Status: Abnormal   Collection Time: 09/24/16 10:05 AM  Result Value Ref Range   Sodium 139 135 - 145 mmol/L   Potassium 3.6 3.5 - 5.1 mmol/L   Chloride 104 101 - 111 mmol/L   CO2 25 22 - 32 mmol/L   Glucose, Bld 105 (H) 65 - 99 mg/dL   BUN 6 6 - 20 mg/dL   Creatinine, Ser 0.79 0.44 - 1.00 mg/dL   Calcium 8.6 (L) 8.9 - 10.3 mg/dL   Total Protein 5.3 (L) 6.5 - 8.1 g/dL   Albumin 2.5 (L) 3.5 - 5.0 g/dL   AST 40 15 - 41 U/L   ALT 17 14 - 54 U/L   Alkaline Phosphatase 88 38 - 126 U/L   Total Bilirubin 0.4 0.3 -  1.2 mg/dL   GFR calc non Af Amer >60 >60 mL/min   GFR calc Af Amer >60 >60 mL/min   Anion gap 10 5 - 15     Blood type: A POS (12/29 1100)  Rubella: Immune (06/16 0000)     Physical Exam:   General: alert, cooperative and no distress  CV: Regular rate and rhythm, S1S2 present or without murmur or extra heart sounds  Resp: clear  Abdomen: soft, nontender, normal bowel sounds  Incision: clean, dry, intact  Uterine Fundus: firm, 1 FB below umbilicus, nontender  Lochia: minimal  Ext: edema trace and Homans sign is negative, no sign of DVT   Assessment/Plan:  35 y.o.   POD# 1.  S/P Cesarean Delivery.  Indications: twins / PEC                Active Problems:   Postpartum care following cesarean delivery (12/30) Indication: Twins / PEC - stable PIH labs  Doing well, stable.               Regular diet as tolerated D/C IV  per unit protocol Increase Labetalol 100 mg from BID to TID Ambulate Routine post-op care  Graceann Congress, MSN, CNM 09/24/2016, 10:30 AM

## 2016-09-25 DIAGNOSIS — O99214 Obesity complicating childbirth: Secondary | ICD-10-CM | POA: Diagnosis not present

## 2016-09-25 DIAGNOSIS — O134 Gestational [pregnancy-induced] hypertension without significant proteinuria, complicating childbirth: Secondary | ICD-10-CM | POA: Diagnosis not present

## 2016-09-25 DIAGNOSIS — O9962 Diseases of the digestive system complicating childbirth: Secondary | ICD-10-CM | POA: Diagnosis not present

## 2016-09-25 DIAGNOSIS — O3429 Maternal care due to uterine scar from other previous surgery: Secondary | ICD-10-CM | POA: Diagnosis not present

## 2016-09-25 DIAGNOSIS — O321XX2 Maternal care for breech presentation, fetus 2: Secondary | ICD-10-CM | POA: Diagnosis not present

## 2016-09-25 DIAGNOSIS — Z6830 Body mass index (BMI) 30.0-30.9, adult: Secondary | ICD-10-CM | POA: Diagnosis not present

## 2016-09-25 DIAGNOSIS — E669 Obesity, unspecified: Secondary | ICD-10-CM | POA: Diagnosis not present

## 2016-09-25 DIAGNOSIS — O30043 Twin pregnancy, dichorionic/diamniotic, third trimester: Secondary | ICD-10-CM | POA: Diagnosis not present

## 2016-09-25 MED ORDER — LABETALOL HCL 200 MG PO TABS
400.0000 mg | ORAL_TABLET | Freq: Three times a day (TID) | ORAL | Status: DC
  Administered 2016-09-26: 400 mg via ORAL
  Filled 2016-09-25: qty 2

## 2016-09-25 MED ORDER — LABETALOL HCL 200 MG PO TABS
200.0000 mg | ORAL_TABLET | Freq: Two times a day (BID) | ORAL | Status: DC
  Administered 2016-09-25 (×2): 200 mg via ORAL
  Filled 2016-09-25 (×2): qty 1

## 2016-09-25 MED ORDER — HYDRALAZINE HCL 10 MG PO TABS
10.0000 mg | ORAL_TABLET | Freq: Four times a day (QID) | ORAL | Status: DC
  Administered 2016-09-25 – 2016-09-27 (×7): 10 mg via ORAL
  Filled 2016-09-25 (×12): qty 1

## 2016-09-25 MED ORDER — LABETALOL HCL 200 MG PO TABS
200.0000 mg | ORAL_TABLET | ORAL | Status: AC
  Administered 2016-09-26: 200 mg via ORAL
  Filled 2016-09-25: qty 1

## 2016-09-25 NOTE — Progress Notes (Signed)
Call by nurse to report elevated bps but no symptoms.  bp's reviewed Labs from 12/30 and 12/31 stable.   Consult with Dr. Garwin Brothers who asks to add hydralizine to the labetalol.  [name]. 09/25/2016 7:31 PM

## 2016-09-25 NOTE — Progress Notes (Signed)
Patient ID: [name], female   DOB: [birth year], 36 y.o.   MRN: [id number] Subjective: POD# 2 Information for the patient's newborn:  Cedrica, Kelley [id number]  female Information for the patient's newborn:  Idania, Ogaz [id number]  female  / circ planning x2  Reports feeling well. Feeding: breast x2 Patient reports tolerating PO.  Breast symptoms: (+) colostrum. Both babies are nursing really well. Pain controlled with ibuprofen (OTC) and narcotic analgesics including oxycodone/acetaminophen (Percocet, Tylox) Denies HA/SOB/C/P/N/V/dizziness. Flatus present. No BM. She reports vaginal bleeding as normal, without clots.  She is ambulating, urinating without difficult.     Objective:   VS:  Vitals:   09/24/16 1837 09/24/16 2215 09/24/16 2315 09/25/16 0556  BP: 140/86 (!) 155/97 (!) 148/98 (!) 157/93  Pulse: 91 80 80 72  Resp: 20 18  18   Temp: 98.3 F (36.8 C)   98.3 F (36.8 C)  TempSrc: Oral   Oral  SpO2: 100%     Weight:      Height:        No intake or output data in the 24 hours ending 09/25/16 1140      Recent Labs  09/24/16 0550 09/24/16 1005  WBC 7.9 8.2  HGB 10.8* 11.5*  HCT 30.5* 32.4*  PLT 126* 145*     Blood type: --/--/A POS, A POS (12/29 1100)  Rubella: Immune (06/16 0000)     Physical Exam:   General: alert, cooperative and no distress  Abdomen: soft, nontender, normal bowel sounds  Incision: clean, dry, intact and skin well-approximated with suture  Uterine Fundus: firm, 1 FB below umbilicus, nontender  Lochia: minimal  Ext: edema trace and Homans sign is negative, no sign of DVT   Assessment/Plan: 36 y.o.   POD# 1.  S/P Cesarean Delivery.  Indications: twins / PEC                Active Problems:   Postpartum care following cesarean delivery (12/30) Indication: Twins / PEC  - BPs remain elevated  Doing well, stable.               Regular diet as tolerated Change Labetalol to 200 mg BID per consult with Dr. Garwin Brothers Ambulate 2-3  times in hallway to encourage motility of bowel Routine post-op care Anticipate D/C home tomorrow  Laury Deep, M, MSN, CNM 09/25/2016, 8:30 AM

## 2016-09-25 NOTE — Progress Notes (Signed)
Late entry due to pt care. Dr. Pamala Hurry notified of BP after hydralazine dose. Pt denies blurry vision and HA. Labetalol 200mg  given as scheduled.  Recheck in 45 minutes, call with results per MD.

## 2016-09-25 NOTE — Lactation Note (Signed)
This note was copied from a baby's chart. Lactation Consultation Note  Patient Name: [name] [id number] Date: 09/25/2016  Twins boys are now 13 hours old.  Mom states twin B latches well but still some difficulty with twin A latching.  Baby A is at a 6 % weight loss and Baby B is 7%.  Mom is syringe feeding after breastfeeding but it is taking a long time.  Recommended she supplements using a slow flow nipple due to time spent feeding and larger volumes needed.  Instructed to give babies 15+ mls post breastfeeding.  Mom has been pumping every 3 hours and obtaining a few mls of colostrum she gives back to babies.  Encouraged to continue pumping.  Mom will call out for latch assist with Baby A when he is ready.   Maternal Data    Feeding    LATCH Score/Interventions                      Lactation Tools Discussed/Used     Consult Status      Ave Filter 09/25/2016, 1:52 PM

## 2016-09-25 NOTE — Lactation Note (Signed)
This note was copied from a baby's chart. Lactation Consultation Note  Patient Name: [name] [id number] Date: 09/25/2016   Mom reports + breast changes w/pregnancy (there was no appreciable change in breast size, but there was tremendous tenderness at the beginning of the pregnancy & she has been leaking since her 2nd trimester). Mom is pumping q3h & has seen her yield grow from 8mL/pumping session to 64mL/pumping session. Mom has larger-sized nipples. She had been using the size 24 flanges, but I have encouraged her to use the size 27 flanges, instead. The size 27 flanges will likely be a good fit for her, but if they are not, she knows that there will be a Lactation Consultant here from 2300 - 0700 if she needs larger flanges.  Mom had been adding breast milk to the formula when offering a bottle. I have encouraged Mom to offer the breast milk by itself first & then offer the formula ad lib (that way if "[name]" or "[name]" don't finish their bottle, it is formula being discarded, not portions of the EBM).   In light of the twins' early-term gestation & the size of the mother's nipples, I have encouraged her to focus on protecting her milk supply for the time being & not worry as much about latching, when it proves difficult (or they are too sleepy).  She is welcome to offer the breast a couple of times/day, as desired, but I explained to mother that their gestation alone might prevent them from latching/transferring milk well until they get closer to their due dates.   Mom understands that infants need to be fed q3hr & not permitted to sleep through the night. Mom encouraged to increase volume as needed to ensure good rest periods for babies.   Mom's EBL w/delivery was 1048mL.  Matthias Hughs [facility] 09/25/2016, 10:32 PM

## 2016-09-25 NOTE — Progress Notes (Signed)
   09/25/16 2325  Vital Signs  BP (!) 150/101  BP Location Left Arm  Patient Position (if appropriate) Semi-fowlers  BP Method Automatic  Pulse Rate 84  Pulse Rate Source Monitor  Spoke with Dr. Pamala Hurry, medications reviewed and changed. See eMAR. Will continue to monitor.

## 2016-09-26 DIAGNOSIS — O1490 Unspecified pre-eclampsia, unspecified trimester: Secondary | ICD-10-CM | POA: Diagnosis present

## 2016-09-26 LAB — TYPE AND SCREEN
Blood Product Expiration Date: 201801172359
Blood Product Expiration Date: 201801172359
Unit Type and Rh: 6200
Unit Type and Rh: 6200

## 2016-09-26 MED ORDER — HYDRALAZINE HCL 20 MG/ML IJ SOLN
5.0000 mg | Freq: Once | INTRAMUSCULAR | Status: AC
  Administered 2016-09-26: 5 mg via INTRAVENOUS
  Filled 2016-09-26: qty 1

## 2016-09-26 MED ORDER — LABETALOL HCL 200 MG PO TABS
400.0000 mg | ORAL_TABLET | Freq: Two times a day (BID) | ORAL | Status: DC
  Administered 2016-09-26 – 2016-09-27 (×2): 400 mg via ORAL
  Filled 2016-09-26 (×2): qty 2

## 2016-09-26 NOTE — Progress Notes (Signed)
UR chart review completed.  

## 2016-09-26 NOTE — Op Note (Signed)
[name], [name] NO.:  [id number]  MEDICAL RECORD NO.:  [id number]  LOCATION:                                 FACILITY:  PHYSICIAN:  Servando Salina, M.D.DATE OF BIRTH:  10-10-1980  DATE OF PROCEDURE:  09/23/2016 DATE OF DISCHARGE:                              OPERATIVE REPORT   PREOPERATIVE DIAGNOSIS:  Previous myomectomy, intrauterine twin gestation at 29 weeks,  malpresentation 2nd twin.  PROCEDURE:  Primary cesarean section, Kerr hysterotomy.  POSTOPERATIVE DIAGNOSIS:  Previous myomectomy, intrauterine twin gestation at 37 weeks, malpresentation 2nd twin.  ANESTHESIA:  Spinal.  SURGEON:  Servando Salina, MD  ASSISTANT:  None.  DESCRIPTION OF PROCEDURE:  Under adequate spinal anesthesia, the patient was placed in the supine position with a left lateral tilt.  She was sterilely prepped and draped in usual fashion.  Indwelling Foley catheter was sterilely placed, 0.25% Marcaine was injected along the planned Pfannenstiel skin incision site.  Pfannenstiel skin incision was made, carried down to the rectus fascia.  The rectus fascia was opened transversely.  The rectus fascia was then bluntly and sharply dissected off the rectus muscle in superior and inferior fashion.  The rectus muscle was split in the midline.  The parietal peritoneum was entered bluntly and extended.  An Alexis self-retaining retractor was then placed.  Vesicouterine peritoneum was opened transversely.  The bladder was bluntly dissected off lower uterine segment displaced inferiorly.  A curvilinear low transverse uterine incision was then made and extended with bandage scissors.  Intact clear amniotic sac was noted.  Artificial rupture of membranes of clear fluid was done.  Subsequent delivery of a live female from the left maternal from the vertex position was accomplished.  Baby was bulb suctioned in the abdomen.  Cord was clamped, cut.  The baby was transferred ultimately  to the pediatrician, who assigned Apgars of 9 and 10 at 1 and 5 minute.  Second twin sac also ruptured, incomplete breech presentation.  Single footling was encountered, subsequently delivered using the usual breech maneuver with delivery of another live female who was bulb suctioned in the abdomen. Cord was then clamped, cut.  The baby was transferred to the awaiting pediatrician, who assigned Apgars of 9 and 10 at 1 and 5 minutes.  The placenta which was diamniotic, dichorionic; otherwise both sent to Pathology, after being removed, manually intact.  Uterine cavity was cleaned of debris.  Uterine incision had no extension, was closed in 2 layers, the first layer with 0 Monocryl running lock stitch; second layer was imbricated with 0 Monocryl suture.  A small area of bleeding resulted in a figure-of-eight suture x2 placement with good hemostasis noted.  Normal tubes and ovaries were noted bilaterally.  The abdomen was irrigated and suctioned of debris.  Interceed was placed in the lower uterine segment in an inverted T-fashion.  The Alexis retractor was then removed.  The parietal peritoneum was then closed with 2-0 Vicryl with a running stitch.  The rectus fascia was closed with 0 Vicryl x2.  The subcutaneous area was irrigated.  Small bleeders cauterized.  Interrupted 2-0 plain sutures placed and the skin approximated with 4-0 Monocryl subcuticular closure.  Steri-Strips and benzoin was then placed.  Specimen was placenta x2, sent to Pathology.  ESTIMATED BLOOD LOSS:  1 L.  URINE OUTPUT:  30 mL.  INTRAOPERATIVE FLUIDS:  1900 mL.  COUNTS:  Sponge and instrument counts x2 was correct.  COMPLICATION:  None.  The patient tolerated the procedure well, was transferred to recovery room in stable condition.  The babies remained on skin to skin with the mom.     Servando Salina, M.D.   ______________________________ Servando Salina, M.D.    [place]/MEDQ  D:  09/23/2016  T:   09/23/2016  Job:  [id number]

## 2016-09-26 NOTE — Progress Notes (Signed)
CMW Paul notified about BP. Will continue to monitor and will call if it goes >160/100

## 2016-09-26 NOTE — Lactation Note (Signed)
This note was copied from a baby's chart. Lactation Consultation Note Follow up visit at 36 hours of age. Mom is c/o pain with pumping.  Mom uses coconut oil and #27 flange and reports rubbing with pumping.  LC gave mom #30 flange  Appears to be a good fit.  Mom may continue to use #27 on right breast.  Mom to call for assist as needed. LC encouraged mom to continue to slowly increase volume of supplement for babies.  Additional supply bottle provided to mom.  Report given to RN.     Patient Name: [name] [facility] [id number] Date: 09/26/2016 Reason for consult: Follow-up assessment   Maternal Data    Feeding    LATCH Score/Interventions                      Lactation Tools Discussed/Used     Consult Status Consult Status: Follow-up Date: 09/27/16 Follow-up type: In-patient    [name] 09/26/2016, 9:41 PM

## 2016-09-26 NOTE — Progress Notes (Signed)
Subjective: POD# 3 Information for the patient's newborn:  Bexlee, Looper [id number]  female Information for the patient's newborn:  Dawnne, Catallo [id number]  female   circ in progress Baby names: "A" [name]; "B" Isaha  Reports feeling well, no PEC s/s Feeding: breast Patient reports tolerating PO.  Breast symptoms: pumping and feeding Pain controlled with PO meds Denies HA/SOB/C/P/N/V/dizziness. Flatus present, no BM. She reports vaginal bleeding as normal, without clots.  She is ambulating, urinating without difficulty.     Objective:   VS:    Vitals:   09/26/16 0500 09/26/16 0840 09/26/16 0841 09/26/16 0845  BP: (!) 144/99 (!) 162/103 (!) 162/103 (!) 160/104  Pulse: 74  78   Resp: 16     Temp: 97.9 F (36.6 C)     TempSrc:      SpO2:      Weight:      Height:        No intake or output data in the 24 hours ending 09/26/16 0832      Recent Labs  09/24/16 0550 09/24/16 1005  WBC 7.9 8.2  HGB 10.8* 11.5*  HCT 30.5* 32.4*  PLT 126* 145*     Blood type: --/--/A POS (12/29 1100)  Rubella: Immune (06/16 0000)     Physical Exam:  General: alert, cooperative and no distress Abdomen: soft, nontender, normal bowel sounds Incision: clean, dry and intact Uterine Fundus: firm, below umbilicus, nontender Lochia: minimal Ext: no edema, redness or tenderness in the calves or thighs      Assessment/Plan: 36 y.o.   POD# 3. [id number] Twins / C/S                 Active Problems:   Postpartum care following cesarean delivery (12/30) Indication: Twins / PEC   Preeclampsia  - BP remains severe range, no neural s/s, labs stable 12/30 and 12/31  - adjust antihypertensive meds - Apresoline 10 mg PO q 6 hrs and Labetalol 400 mg BID  - Apresoline 5 mg IV now x 1 dose Encouraged rest when babies sleeping Stable post-op Routine post-op care  POC in consult w/ Dr. Eric Form, CNM, MSN 09/26/2016, 8:32 AM

## 2016-09-27 MED ORDER — IBUPROFEN 600 MG PO TABS: 600.0000 mg | ORAL_TABLET | Freq: Four times a day (QID) | ORAL | 0 refills | Status: DC

## 2016-09-27 MED ORDER — OXYCODONE-ACETAMINOPHEN 5-325 MG PO TABS: 1.0000 | ORAL_TABLET | ORAL | 0 refills | Status: DC | PRN

## 2016-09-27 MED ORDER — HYDRALAZINE HCL 10 MG PO TABS: 10.0000 mg | ORAL_TABLET | Freq: Four times a day (QID) | ORAL | 0 refills | Status: DC

## 2016-09-27 MED ORDER — LABETALOL HCL 200 MG PO TABS: 400.0000 mg | ORAL_TABLET | Freq: Two times a day (BID) | ORAL | 0 refills | Status: DC

## 2016-09-27 NOTE — Addendum Note (Signed)
Addendum  created 09/27/16 0802 by Georgeanne Nim, CRNA   Sign clinical note

## 2016-09-27 NOTE — Lactation Note (Signed)
This note was copied from a baby's chart. Lactation Consultation Note:   Mother states that she attempt to latch baby B again yesterday, but unable to latch so he is still being bottle fed. Mother states that baby A is breastfeeding well.  Mother has a Pump N Style for home use. Mother is pumping every 2-3 hours. Mother goes to [facility] for weight check .  I scheduled mother to follow up in out patient on January 11 at 1;00 and 2;30.    Patient Name: SHIAH OAKEY [facility] [id number] Date: 09/27/2016     Maternal Data    Feeding    LATCH Score/Interventions                      Lactation Tools Discussed/Used     Consult Status      Jess Barters McCoy 09/27/2016, 11:00 AM

## 2016-09-27 NOTE — Anesthesia Postprocedure Evaluation (Signed)
Anesthesia Post Note  Patient: [name]  Procedure(s) Performed: Procedure(s) (LRB): Primary CESAREAN SECTION MULTI-GESTATIONAL (N/A)  Patient location during evaluation: Mother Baby Anesthesia Type: Spinal Level of consciousness: awake Pain management: pain level controlled Vital Signs Assessment: post-procedure vital signs reviewed and stable Respiratory status: spontaneous breathing Cardiovascular status: stable Postop Assessment: no headache, no backache, spinal receding and patient able to bend at knees Anesthetic complications: no        Last Vitals:  Vitals:   09/26/16 2215 09/27/16 0535  BP: (!) 153/97 (!) 150/99  Pulse: 83 86  Resp: 20 19  Temp:  36.7 C    Last Pain:  Vitals:   09/27/16 0535  TempSrc: Oral  PainSc:    Pain Goal: Patients Stated Pain Goal: 1 (09/25/16 2249)               Everette Rank

## 2016-09-27 NOTE — Progress Notes (Signed)
POSTOPERATIVE DAY # 4 S/P CS   S:         Reports feeling well without PIH symptoms - wants to go home             Tolerating po intake / no nausea / no vomiting / + flatus / + BM             Bleeding is light             Pain controlled with motrin and oxycodone             Up ad lib / ambulatory/ voiding QS  Newborn breast feeding  / Circumcision done x 2   O:  VS: BP 128/84 (BP Location: Right Arm)   Pulse 83   Temp 98 F (36.7 C) (Oral)   Resp 19   Ht 5\' 7"  (1.702 m)   Wt 87.1 kg (192 lb)   SpO2 100%   Breastfeeding? Unknown   BMI 30.07 kg/m     LABS: stable without evidence of PEC              Recent Labs  09/24/16 1005  WBC 8.2  HGB 11.5*  PLT 145*               Bloodtype: --/--/A POS (12/29 1100)  Rubella: Immune (06/16 0000)                                Physical Exam:             Alert and Oriented X3  Lungs: Clear and unlabored  Heart: regular rate and rhythm / no mumurs  Abdomen: soft, non-tender, non-distended             Fundus: firm, non-tender, U-1             Dressing intact honeycomb              Incision:  approximated with suture / no erythema / no ecchymosis / no drainage  Perineum: intact  Lochia: light  Extremities: trace right ankle edema, no calf pain or tenderness, negative Homans  A:        POD # 4 S/P CS            Hypertension without evidence of PEC  P:        Routine postoperative care              DC home after lunch if BP stable - repeat BP check at [facility] within 1 week / can check BP at home     Artelia Laroche CNM, MSN, [facility] 09/27/2016, 9:39 AM

## 2016-09-27 NOTE — Discharge Summary (Signed)
OB Discharge Summary  Patient Name: [name] DOB: 21-Jul-1981 MRN: [id number]  Date of admission: 09/23/2016  Admitting diagnosis: C- SECTION MULTI GESTATION Twins, History of Myomectomy Intrauterine pregnancy: [redacted]w[redacted]d     Secondary diagnosis: presenting twin - B   Date of discharge: 09/27/2016    Discharge diagnosis: Term Pregnancy Delivered, Gestational Hypertension and POD 4 s/p cesarean section -twin gestation      Prenatal history: G3P1021   EDC : 10/14/2016, by Other Basis  Prenatal care at [facility]  Primary provider : Taniqua Issa Prenatal course complicated by myomectomy, twin gestation  Prenatal Labs: ABO, Rh: --/--/A POS (12/29 1100)  Antibody: Negative (06/16 0000) Rubella: Immune (06/16 0000)   RPR: Non Reactive (12/29 1100)  HBsAg: Negative (06/16 0000)  HIV: Non-reactive (06/16 0000)  GBS:                                      Hospital course:  Sceduled C/S   36 y.o. yo [id number] at [redacted]w[redacted]d was admitted to the hospital 09/23/2016 for scheduled cesarean section with the following indication:previous myomectomy and twin gestation.  Membrane Rupture Time/Date:    Jozalyn, Walkenhorst [id number]  7:59 AM   Abelardo Diesel [id number]  8:00 AM ,   Sista, Salatino [id number]  09/23/2016   Avalee, Kappen [id number]  09/23/2016   Patient delivered a Viable infants   Nicolet, Nega [id number]  09/23/2016   Wyona, Bales [id number]  09/23/2016  Details of operation can be found in separate operative note.  Postpartum complicated by continued elevation of blood pressure  1st noted in preop area and continued postpartum requiring  Labetalol and hydralazine. Rowan labs were normal.  She is ambulating, tolerating a regular diet, passing flatus, and urinating well. Patient is discharged home in stable condition on  09/27/16          Delivering PROVIDER:   Kaylonni, Mank [id number]  Nidia Grogan, Juanette, Rinner [id number]  Chrles Selley                                                            Complications: None  Newborn Data:    Yewande, Peyer [id number]  Live born female  Birth Weight: 6 lb 9.6 oz (2995 g) APGAR: 9, 10   Idalis, Stcyr [id number]  Live born female  Birth Weight: 6 lb 12.5 oz (3075 g) APGAR: 9, 10  Baby Feeding: Breast Disposition:home with mother  Post partum procedures:none  Postpartum contraception: Not Discussed    Labs: Lab Results  Component Value Date   WBC 8.2 09/24/2016   HGB 11.5 (L) 09/24/2016   HCT 32.4 (L) 09/24/2016   MCV 96.1 09/24/2016   PLT 145 (L) 09/24/2016   CMP Latest Ref Rng & Units 09/24/2016  Glucose 65 - 99 mg/dL 105(H)  BUN 6 - 20 mg/dL 6  Creatinine 0.44 - 1.00 mg/dL 0.79  Sodium 135 - 145 mmol/L 139  Potassium 3.5 - 5.1 mmol/L 3.6  Chloride 101 - 111 mmol/L 104  CO2 22 - 32 mmol/L 25  Calcium 8.9 - 10.3 mg/dL 8.6(L)  Total Protein 6.5 -  8.1 g/dL 5.3(L)  Total Bilirubin 0.3 - 1.2 mg/dL 0.4  Alkaline Phos 38 - 126 U/L 88  AST 15 - 41 U/L 40  ALT 14 - 54 U/L 17    Physical Exam @ time of discharge:  Vitals:   09/26/16 2215 09/27/16 0535 09/27/16 0823 09/27/16 0825  BP: (!) 153/97 (!) 150/99 (!) 145/100 128/84  Pulse: 83 86 75 83  Resp: 20 19    Temp:  98 F (36.7 C)    TempSrc:  Oral    SpO2:      Weight:      Height:        General: alert, cooperative and no distress Lochia: appropriate Uterine Fundus: firm Perineum: intact Incision: Healing well with no significant drainage Extremities: trace right edema DVT Evaluation: No evidence of DVT seen on physical exam.   Discharge instructions:  "Baby and Me Booklet" and [facility] Booklet  Discharge Medications:  Allergies as of 09/27/2016      Reactions   Doxycycline Rash      Medication List    TAKE these medications   hydrALAZINE 10 MG tablet Commonly known as:  APRESOLINE Take 1 tablet (10 mg total) by mouth every  6 (six) hours.   ibuprofen 600 MG tablet Commonly known as:  ADVIL,MOTRIN Take 1 tablet (600 mg total) by mouth every 6 (six) hours.   labetalol 200 MG tablet Commonly known as:  NORMODYNE Take 2 tablets (400 mg total) by mouth 2 (two) times daily.   omega-3 acid ethyl esters 1 g capsule Commonly known as:  LOVAZA Take 1 g by mouth daily.   oxyCODONE-acetaminophen 5-325 MG tablet Commonly known as:  PERCOCET/ROXICET Take 1 tablet by mouth every 4 (four) hours as needed (pain scale 4-7).   prenatal multivitamin Tabs tablet Take 1 tablet by mouth daily.       Diet: routine diet  Activity: Advance as tolerated. Pelvic rest x 6 weeks.   Follow up:2 weeks BP check with smart start RN   Signed: Artelia Laroche CNM, MSN, [facility] 09/27/2016, 10:00 AM

## 2016-10-03 DIAGNOSIS — O135 Gestational [pregnancy-induced] hypertension without significant proteinuria, complicating the puerperium: Secondary | ICD-10-CM | POA: Diagnosis not present

## 2016-10-04 ENCOUNTER — Ambulatory Visit (HOSPITAL_COMMUNITY): Payer: 59

## 2016-10-04 MED FILL — LABETALOL HCL 300 MG TABLET: 300 | 30 days supply | Qty: 120 | Fill #0

## 2016-10-05 ENCOUNTER — Ambulatory Visit (HOSPITAL_COMMUNITY): Payer: 59

## 2016-11-07 DIAGNOSIS — Z130 Encounter for screening for diseases of the blood and blood-forming organs and certain disorders involving the immune mechanism: Secondary | ICD-10-CM | POA: Diagnosis not present

## 2016-11-22 DIAGNOSIS — Z3183 Encounter for assisted reproductive fertility procedure cycle: Secondary | ICD-10-CM | POA: Diagnosis not present

## 2017-04-19 ENCOUNTER — Ambulatory Visit (INDEPENDENT_AMBULATORY_CARE_PROVIDER_SITE_OTHER): Payer: 59 | Admitting: Obstetrics & Gynecology

## 2017-04-19 ENCOUNTER — Encounter: Payer: Self-pay | Admitting: Obstetrics & Gynecology

## 2017-04-19 VITALS — BP 134/84 | HR 72 | Temp 97.5°F | Ht 68.5 in | Wt 162.4 lb

## 2017-04-19 DIAGNOSIS — Z3009 Encounter for other general counseling and advice on contraception: Secondary | ICD-10-CM | POA: Diagnosis not present

## 2017-04-19 DIAGNOSIS — Z01419 Encounter for gynecological examination (general) (routine) without abnormal findings: Secondary | ICD-10-CM

## 2017-04-19 NOTE — Patient Instructions (Signed)
1. Well female exam with routine gynecological exam Normal Gyn exam.  Pap normal 10/2016, will repeat at 2 years.  Breasts wnl.  2. Encounter for other general counseling or advice on contraception IVF pregnancy.  Declines contraception.  Currently abstinent.  Had a C/S 6 months ago.  Condoms recommended if sexually active until 9 mths post C/S.  School wellness exam form filled out.  [name], it was a pleasure to meet you today!  The best in your studies and see you soon!

## 2017-04-19 NOTE — Progress Notes (Signed)
[name] [facility] October 09, 1980 [id number]   History:    36 y.o. G1P1L2 Married.  From [place] (in [place] x age 57)  2 boys IVF 80 month old by C/S.  RP:  Established patient presenting for annual gyn exam and School exam  HPI:  Breastfeeding twins.  No menses.  Not currently sexually active.  No pelvic pain.  Normal vaginal secretions.  Breasts wnl.  Mictions/BMs wnl.  Past medical history,surgical history, family history and social history were all reviewed and documented in the EPIC chart.  Gynecologic History No LMP recorded. Patient is not currently having periods (Reason: Lactating). Contraception: abstinence Last Pap: 10/2016. Results were: normal per patient Last mammogram: Never.   Obstetric History OB History  Gravida Para Term Preterm AB Living  3 1 1   2 1   SAB TAB Ectopic Multiple Live Births  1 1   1 1     # Outcome Date GA Lbr Len/2nd Weight Sex Delivery Anes PTL Lv  3A Term 09/23/16 [redacted]w[redacted]d  6 lb 9.6 oz (2.995 kg) M CS-LTranv Spinal  LIV     Birth Comments: normal exam  3B Term 09/23/16 [redacted]w[redacted]d  6 lb 12.5 oz (3.075 kg) M CS-LTranv Spinal  LIV     Birth Comments: normal  2 SAB           1 TAB                ROS: A ROS was performed and pertinent positives and negatives are included in the history.  GENERAL: No fevers or chills. HEENT: No change in vision, no earache, sore throat or sinus congestion. NECK: No pain or stiffness. CARDIOVASCULAR: No chest pain or pressure. No palpitations. PULMONARY: No shortness of breath, cough or wheeze. GASTROINTESTINAL: No abdominal pain, nausea, vomiting or diarrhea, melena or bright red blood per rectum. GENITOURINARY: No urinary frequency, urgency, hesitancy or dysuria. MUSCULOSKELETAL: No joint or muscle pain, no back pain, no recent trauma. DERMATOLOGIC: No rash, no itching, no lesions. ENDOCRINE: No polyuria, polydipsia, no heat or cold intolerance. No recent change in weight. HEMATOLOGICAL: No anemia or easy bruising or bleeding.  NEUROLOGIC: No headache, seizures, numbness, tingling or weakness. PSYCHIATRIC: No depression, no loss of interest in normal activity or change in sleep pattern.     Exam:   BP 134/84   Ht 5' 8.5" (1.74 m)   Wt 162 lb 6.4 oz (73.7 kg)   BMI 24.33 kg/m   Body mass index is 24.33 kg/m.  General appearance : Well developed well nourished female. No acute distress HEENT: Eyes: no retinal hemorrhage or exudates,  Neck supple, trachea midline, no carotid bruits, no thyroidmegaly Lungs: Clear to auscultation, no rhonchi or wheezes, or rib retractions  Heart: Regular rate and rhythm, no murmurs or gallops Breast:Examined in sitting and supine position were symmetrical in appearance, no palpable masses or tenderness,  no skin retraction, no nipple inversion, no nipple discharge, no skin discoloration, no axillary or supraclavicular lymphadenopathy Abdomen: no palpable masses or tenderness, no rebound or guarding Extremities: no edema or skin discoloration or tenderness  Pelvic:  Bartholin, Urethra, Skene Glands: Within normal limits             Vagina: No gross lesions or discharge  Cervix: No gross lesions or discharge  Uterus  AV, normal size, shape and consistency, non-tender and mobile  Adnexa  Without masses or tenderness  Anus and perineum  normal   Assessment/Plan:  36 y.o. female for annual exam  1. Well female exam with routine gynecological exam Normal Gyn exam.  Pap normal 10/2016, will repeat at 2 years.  Breasts wnl.  2. Encounter for other general counseling or advice on contraception IVF pregnancy.  Declines contraception.  Currently abstinent.  Had a C/S 6 months ago.  Condoms recommended if sexually active until 9 mths post C/S.  School wellness exam form filled out.  Princess Bruins MD, 2:24 PM 04/19/2017

## 2018-04-22 ENCOUNTER — Ambulatory Visit (INDEPENDENT_AMBULATORY_CARE_PROVIDER_SITE_OTHER): Payer: 59 | Admitting: Women's Health

## 2018-04-22 ENCOUNTER — Encounter: Payer: Self-pay | Admitting: Women's Health

## 2018-04-22 VITALS — BP 142/94 | Ht 67.2 in | Wt 179.4 lb

## 2018-04-22 DIAGNOSIS — Z01419 Encounter for gynecological examination (general) (routine) without abnormal findings: Secondary | ICD-10-CM | POA: Diagnosis not present

## 2018-04-22 DIAGNOSIS — N920 Excessive and frequent menstruation with regular cycle: Secondary | ICD-10-CM | POA: Diagnosis not present

## 2018-04-22 DIAGNOSIS — Z13220 Encounter for screening for lipoid disorders: Secondary | ICD-10-CM

## 2018-04-22 LAB — COMPREHENSIVE METABOLIC PANEL
AG Ratio: 1.5 (calc) (ref 1.0–2.5)
ALT: 13 U/L (ref 6–29)
AST: 14 U/L (ref 10–30)
Albumin: 4.3 g/dL (ref 3.6–5.1)
Alkaline phosphatase (APISO): 57 U/L (ref 33–115)
BUN: 11 mg/dL (ref 7–25)
CO2: 27 mmol/L (ref 20–32)
Calcium: 9.1 mg/dL (ref 8.6–10.2)
Chloride: 103 mmol/L (ref 98–110)
Creat: 0.87 mg/dL (ref 0.50–1.10)
Globulin: 2.9 g/dL (calc) (ref 1.9–3.7)
Glucose, Bld: 93 mg/dL (ref 65–99)
Potassium: 4 mmol/L (ref 3.5–5.3)
Sodium: 138 mmol/L (ref 135–146)
Total Bilirubin: 0.5 mg/dL (ref 0.2–1.2)
Total Protein: 7.2 g/dL (ref 6.1–8.1)

## 2018-04-22 LAB — CBC WITH DIFFERENTIAL/PLATELET
Basophils Absolute: 41 cells/uL (ref 0–200)
Basophils Relative: 1.1 %
Eosinophils Absolute: 70 cells/uL (ref 15–500)
Eosinophils Relative: 1.9 %
HCT: 35.2 % (ref 35.0–45.0)
Hemoglobin: 11.8 g/dL (ref 11.7–15.5)
Lymphs Abs: 1354 cells/uL (ref 850–3900)
MCH: 29.8 pg (ref 27.0–33.0)
MCHC: 33.5 g/dL (ref 32.0–36.0)
MCV: 88.9 fL (ref 80.0–100.0)
MPV: 10.3 fL (ref 7.5–12.5)
Monocytes Relative: 7.5 %
Neutro Abs: 1957 cells/uL (ref 1500–7800)
Neutrophils Relative %: 52.9 %
Platelets: 322 10*3/uL (ref 140–400)
RBC: 3.96 10*6/uL (ref 3.80–5.10)
RDW: 13.2 % (ref 11.0–15.0)
Total Lymphocyte: 36.6 %
WBC mixed population: 278 cells/uL (ref 200–950)
WBC: 3.7 10*3/uL — ABNORMAL LOW (ref 3.8–10.8)

## 2018-04-22 LAB — LIPID PANEL
Cholesterol: 187 mg/dL (ref <200)
HDL: 57 mg/dL (ref >50)
LDL Cholesterol (Calc): 115 mg/dL (calc) — ABNORMAL HIGH
Non-HDL Cholesterol (Calc): 130 mg/dL (calc) — ABNORMAL HIGH (ref <130)
Total CHOL/HDL Ratio: 3.3 (calc) (ref <5.0)
Triglycerides: 61 mg/dL (ref <150)

## 2018-04-22 LAB — TSH: TSH: 0.63 mIU/L

## 2018-04-22 NOTE — Progress Notes (Signed)
[name] [facility] 11/22/1980 [id number]    History:    Presents for annual exam.  Monthly cycle, using no contraception history of infertility.  Conceived IVF twins Katy Apo and Oswaldo Milian age 36 months both doing well.  Normal Pap  history.  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  Hypertension end of pregnancy and was on medication first month postpartum.  Numerous family members with hypertension.  Past medical history, past surgical history, family history and social history were all reviewed and documented in the EPIC chart.  History of blocked fallopian tubes with revision, IVF pregnancies x2 ended with miscarriage.  ROS:  A ROS was performed and pertinent positives and negatives are included.  Exam:  Vitals:   04/22/18 1042  BP: (!) 142/94  Weight: 179 lb 6.4 oz (81.4 kg)  Height: 5' 7.2" (1.707 m)   Body mass index is 27.93 kg/m.   General appearance:  Normal Thyroid:  Symmetrical, normal in size, without palpable masses or nodularity. Respiratory  Auscultation:  Clear without wheezing or rhonchi Cardiovascular  Auscultation:  Regular rate, without rubs, murmurs or gallops  Edema/varicosities:  Not grossly evident Abdominal  Soft,nontender, without masses, guarding or rebound.  Liver/spleen:  No organomegaly noted  Hernia:  None appreciated  Skin  Inspection:  Grossly normal   Breasts: Examined lying and sitting.     Right: Without masses, retractions, discharge or axillary adenopathy.     Left: Without masses, retractions, discharge or axillary adenopathy. Gentitourinary   Inguinal/mons:  Normal without inguinal adenopathy  External genitalia:  Normal  BUS/Urethra/Skene's glands:  Normal  Vagina:  Normal  Cervix:  Normal  Uterus:   normal in size, shape and contour.  Midline and mobile  Adnexa/parametria:     Rt: Without masses or tenderness.   Lt: Without masses or tenderness.  Anus  and perineum: Normal  Digital rectal exam: Normal sphincter tone without palpated masses or tenderness  Assessment/Plan:  37 y.o. MPF G3, P2/twins for annual exam.     Monthly cycle with spotting past 2 months Elevated blood pressure  Plan: CBC, TSH, lipid panel, CMP.  Reviewed if spotting persists and normal TSH will proceed to sonohysterogram with Dr. Dellis Filbert.   SBE's, exercise, calcium rich foods, MVI daily encouraged.  Reviewed need for checking blood pressure at home or work following up with primary care if continues greater than 130/80.  Reviewed health risks with untreated hypertension.  Encouraged DASH diet, exercise.  Agreeable with plan.  Pap with HR HPV typing, new screening guidelines reviewed.    [place], 11:59 AM 04/22/2018

## 2018-04-22 NOTE — Patient Instructions (Signed)
Health Maintenance, Female Adopting a healthy lifestyle and getting preventive care can go a long way to promote health and wellness. Talk with your health care provider about what schedule of regular examinations is right for you. This is a good chance for you to check in with your provider about disease prevention and staying healthy. In between checkups, there are plenty of things you can do on your own. Experts have done a lot of research about which lifestyle changes and preventive measures are most likely to keep you healthy. Ask your health care provider for more information. Weight and diet Eat a healthy diet  Be sure to include plenty of vegetables, fruits, low-fat dairy products, and lean protein.  Do not eat a lot of foods high in solid fats, added sugars, or salt.  Get regular exercise. This is one of the most important things you can do for your health. ? Most adults should exercise for at least 150 minutes each week. The exercise should increase your heart rate and make you sweat (moderate-intensity exercise). ? Most adults should also do strengthening exercises at least twice a week. This is in addition to the moderate-intensity exercise.  Maintain a healthy weight  Body mass index (BMI) is a measurement that can be used to identify possible weight problems. It estimates body fat based on height and weight. Your health care provider can help determine your BMI and help you achieve or maintain a healthy weight.  For females 20 years of age and older: ? A BMI below 18.5 is considered underweight. ? A BMI of 18.5 to 24.9 is normal. ? A BMI of 25 to 29.9 is considered overweight. ? A BMI of 30 and above is considered obese.  Watch levels of cholesterol and blood lipids  You should start having your blood tested for lipids and cholesterol at 36 years of age, then have this test every 5 years.  You may need to have your cholesterol levels checked more often if: ? Your lipid or  cholesterol levels are high. ? You are older than 36 years of age. ? You are at high risk for heart disease.  Cancer screening Lung Cancer  Lung cancer screening is recommended for adults 55-80 years old who are at high risk for lung cancer because of a history of smoking.  A yearly low-dose CT scan of the lungs is recommended for people who: ? Currently smoke. ? Have quit within the past 15 years. ? Have at least a 30-pack-year history of smoking. A pack year is smoking an average of one pack of cigarettes a day for 1 year.  Yearly screening should continue until it has been 15 years since you quit.  Yearly screening should stop if you develop a health problem that would prevent you from having lung cancer treatment.  Breast Cancer  Practice breast self-awareness. This means understanding how your breasts normally appear and feel.  It also means doing regular breast self-exams. Let your health care provider know about any changes, no matter how small.  If you are in your 20s or 30s, you should have a clinical breast exam (CBE) by a health care provider every 1-3 years as part of a regular health exam.  If you are 40 or older, have a CBE every year. Also consider having a breast X-ray (mammogram) every year.  If you have a family history of breast cancer, talk to your health care provider about genetic screening.  If you are at high risk   for breast cancer, talk to your health care provider about having an MRI and a mammogram every year.  Breast cancer gene (BRCA) assessment is recommended for women who have family members with BRCA-related cancers. BRCA-related cancers include: ? Breast. ? Ovarian. ? Tubal. ? Peritoneal cancers.  Results of the assessment will determine the need for genetic counseling and BRCA1 and BRCA2 testing.  Cervical Cancer Your health care provider may recommend that you be screened regularly for cancer of the pelvic organs (ovaries, uterus, and  vagina). This screening involves a pelvic examination, including checking for microscopic changes to the surface of your cervix (Pap test). You may be encouraged to have this screening done every 3 years, beginning at age 22.  For women ages 56-65, health care providers may recommend pelvic exams and Pap testing every 3 years, or they may recommend the Pap and pelvic exam, combined with testing for human papilloma virus (HPV), every 5 years. Some types of HPV increase your risk of cervical cancer. Testing for HPV may also be done on women of any age with unclear Pap test results.  Other health care providers may not recommend any screening for nonpregnant women who are considered low risk for pelvic cancer and who do not have symptoms. Ask your health care provider if a screening pelvic exam is right for you.  If you have had past treatment for cervical cancer or a condition that could lead to cancer, you need Pap tests and screening for cancer for at least 20 years after your treatment. If Pap tests have been discontinued, your risk factors (such as having a new sexual partner) need to be reassessed to determine if screening should resume. Some women have medical problems that increase the chance of getting cervical cancer. In these cases, your health care provider may recommend more frequent screening and Pap tests.  Colorectal Cancer  This type of cancer can be detected and often prevented.  Routine colorectal cancer screening usually begins at 36 years of age and continues through 36 years of age.  Your health care provider may recommend screening at an earlier age if you have risk factors for colon cancer.  Your health care provider may also recommend using home test kits to check for hidden blood in the stool.  A small camera at the end of a tube can be used to examine your colon directly (sigmoidoscopy or colonoscopy). This is done to check for the earliest forms of colorectal  cancer.  Routine screening usually begins at age 33.  Direct examination of the colon should be repeated every 5-10 years through 36 years of age. However, you may need to be screened more often if early forms of precancerous polyps or small growths are found.  Skin Cancer  Check your skin from head to toe regularly.  Tell your health care provider about any new moles or changes in moles, especially if there is a change in a mole's shape or color.  Also tell your health care provider if you have a mole that is larger than the size of a pencil eraser.  Always use sunscreen. Apply sunscreen liberally and repeatedly throughout the day.  Protect yourself by wearing long sleeves, pants, a wide-brimmed hat, and sunglasses whenever you are outside.  Heart disease, diabetes, and high blood pressure  High blood pressure causes heart disease and increases the risk of stroke. High blood pressure is more likely to develop in: ? People who have blood pressure in the high end of  the normal range (130-139/85-89 mm Hg). ? People who are overweight or obese. ? People who are African American.  If you are 21-29 years of age, have your blood pressure checked every 3-5 years. If you are 3 years of age or older, have your blood pressure checked every year. You should have your blood pressure measured twice-once when you are at a hospital or clinic, and once when you are not at a hospital or clinic. Record the average of the two measurements. To check your blood pressure when you are not at a hospital or clinic, you can use: ? An automated blood pressure machine at a pharmacy. ? A home blood pressure monitor.  If you are between 17 years and 37 years old, ask your health care provider if you should take aspirin to prevent strokes.  Have regular diabetes screenings. This involves taking a blood sample to check your fasting blood sugar level. ? If you are at a normal weight and have a low risk for diabetes,  have this test once every three years after 36 years of age. ? If you are overweight and have a high risk for diabetes, consider being tested at a younger age or more often. Preventing infection Hepatitis B  If you have a higher risk for hepatitis B, you should be screened for this virus. You are considered at high risk for hepatitis B if: ? You were born in a country where hepatitis B is common. Ask your health care provider which countries are considered high risk. ? Your parents were born in a high-risk country, and you have not been immunized against hepatitis B (hepatitis B vaccine). ? You have HIV or AIDS. ? You use needles to inject street drugs. ? You live with someone who has hepatitis B. ? You have had sex with someone who has hepatitis B. ? You get hemodialysis treatment. ? You take certain medicines for conditions, including cancer, organ transplantation, and autoimmune conditions.  Hepatitis C  Blood testing is recommended for: ? Everyone born from [birth year] through 1964. ? Anyone with known risk factors for hepatitis C.  Sexually transmitted infections (STIs)  You should be screened for sexually transmitted infections (STIs) including gonorrhea and chlamydia if: ? You are sexually active and are younger than 36 years of age. ? You are older than 36 years of age and your health care provider tells you that you are at risk for this type of infection. ? Your sexual activity has changed since you were last screened and you are at an increased risk for chlamydia or gonorrhea. Ask your health care provider if you are at risk.  If you do not have HIV, but are at risk, it may be recommended that you take a prescription medicine daily to prevent HIV infection. This is called pre-exposure prophylaxis (PrEP). You are considered at risk if: ? You are sexually active and do not regularly use condoms or know the HIV status of your partner(s). ? You take drugs by injection. ? You are  sexually active with a partner who has HIV.  Talk with your health care provider about whether you are at high risk of being infected with HIV. If you choose to begin PrEP, you should first be tested for HIV. You should then be tested every 3 months for as long as you are taking PrEP. Pregnancy  If you are premenopausal and you may become pregnant, ask your health care provider about preconception counseling.  If you may become  pregnant, take 400 to 800 micrograms (mcg) of folic acid every day.  If you want to prevent pregnancy, talk to your health care provider about birth control (contraception). Osteoporosis and menopause  Osteoporosis is a disease in which the bones lose minerals and strength with aging. This can result in serious bone fractures. Your risk for osteoporosis can be identified using a bone density scan.  If you are 65 years of age or older, or if you are at risk for osteoporosis and fractures, ask your health care provider if you should be screened.  Ask your health care provider whether you should take a calcium or vitamin D supplement to lower your risk for osteoporosis.  Menopause may have certain physical symptoms and risks.  Hormone replacement therapy may reduce some of these symptoms and risks. Talk to your health care provider about whether hormone replacement therapy is right for you. Follow these instructions at home:  Schedule regular health, dental, and eye exams.  Stay current with your immunizations.  Do not use any tobacco products including cigarettes, chewing tobacco, or electronic cigarettes.  If you are pregnant, do not drink alcohol.  If you are breastfeeding, limit how much and how often you drink alcohol.  Limit alcohol intake to no more than 1 drink per day for nonpregnant women. One drink equals 12 ounces of beer, 5 ounces of wine, or 1 ounces of hard liquor.  Do not use street drugs.  Do not share needles.  Ask your health care  provider for help if you need support or information about quitting drugs.  Tell your health care provider if you often feel depressed.  Tell your health care provider if you have ever been abused or do not feel safe at home. This information is not intended to replace advice given to you by your health care provider. Make sure you discuss any questions you have with your health care provider. Document Released: 03/27/2011 Document Revised: 02/17/2016 Document Reviewed: 06/15/2015 [employer] Interactive Patient Education  2018 [employer]  DASH Eating Plan DASH stands for "Dietary Approaches to Stop Hypertension." The DASH eating plan is a healthy eating plan that has been shown to reduce high blood pressure (hypertension). It may also reduce your risk for type 2 diabetes, heart disease, and stroke. The DASH eating plan may also help with weight loss. What are tips for following this plan? General guidelines  Avoid eating more than 2,300 mg (milligrams) of salt (sodium) a day. If you have hypertension, you may need to reduce your sodium intake to 1,500 mg a day.  Limit alcohol intake to no more than 1 drink a day for nonpregnant women and 2 drinks a day for men. One drink equals 12 oz of beer, 5 oz of wine, or 1 oz of hard liquor.  Work with your health care provider to maintain a healthy body weight or to lose weight. Ask what an ideal weight is for you.  Get at least 30 minutes of exercise that causes your heart to beat faster (aerobic exercise) most days of the week. Activities may include walking, swimming, or biking.  Work with your health care provider or diet and nutrition specialist (dietitian) to adjust your eating plan to your individual calorie needs. Reading food labels  Check food labels for the amount of sodium per serving. Choose foods with less than 5 percent of the Daily Value of sodium. Generally, foods with less than 300 mg of sodium per serving fit into this eating    plan.  To find whole grains, look for the word "whole" as the first word in the ingredient list. Shopping  Buy products labeled as "low-sodium" or "no salt added."  Buy fresh foods. Avoid canned foods and premade or frozen meals. Cooking  Avoid adding salt when cooking. Use salt-free seasonings or herbs instead of table salt or sea salt. Check with your health care provider or pharmacist before using salt substitutes.  Do not fry foods. Cook foods using healthy methods such as baking, boiling, grilling, and broiling instead.  Cook with heart-healthy oils, such as olive, canola, soybean, or sunflower oil. Meal planning   Eat a balanced diet that includes: ? 5 or more servings of fruits and vegetables each day. At each meal, try to fill half of your plate with fruits and vegetables. ? Up to 6-8 servings of whole grains each day. ? Less than 6 oz of lean meat, poultry, or fish each day. A 3-oz serving of meat is about the same size as a deck of cards. One egg equals 1 oz. ? 2 servings of low-fat dairy each day. ? A serving of nuts, seeds, or beans 5 times each week. ? Heart-healthy fats. Healthy fats called Omega-3 fatty acids are found in foods such as flaxseeds and coldwater fish, like sardines, salmon, and mackerel.  Limit how much you eat of the following: ? Canned or prepackaged foods. ? Food that is high in trans fat, such as fried foods. ? Food that is high in saturated fat, such as fatty meat. ? Sweets, desserts, sugary drinks, and other foods with added sugar. ? Full-fat dairy products.  Do not salt foods before eating.  Try to eat at least 2 vegetarian meals each week.  Eat more home-cooked food and less restaurant, buffet, and fast food.  When eating at a restaurant, ask that your food be prepared with less salt or no salt, if possible. What foods are recommended? The items listed may not be a complete list. Talk with your dietitian about what dietary choices are best  for you. Grains Whole-grain or whole-wheat bread. Whole-grain or whole-wheat pasta. Brown rice. Oatmeal. Quinoa. Bulgur. Whole-grain and low-sodium cereals. Pita bread. Low-fat, low-sodium crackers. Whole-wheat flour tortillas. Vegetables Fresh or frozen vegetables (raw, steamed, roasted, or grilled). Low-sodium or reduced-sodium tomato and vegetable juice. Low-sodium or reduced-sodium tomato sauce and tomato paste. Low-sodium or reduced-sodium canned vegetables. Fruits All fresh, dried, or frozen fruit. Canned fruit in natural juice (without added sugar). Meat and other protein foods Skinless chicken or turkey. Ground chicken or turkey. Pork with fat trimmed off. Fish and seafood. Egg whites. Dried beans, peas, or lentils. Unsalted nuts, nut butters, and seeds. Unsalted canned beans. Lean cuts of beef with fat trimmed off. Low-sodium, lean deli meat. Dairy Low-fat (1%) or fat-free (skim) milk. Fat-free, low-fat, or reduced-fat cheeses. Nonfat, low-sodium ricotta or cottage cheese. Low-fat or nonfat yogurt. Low-fat, low-sodium cheese. Fats and oils Soft margarine without trans fats. Vegetable oil. Low-fat, reduced-fat, or light mayonnaise and salad dressings (reduced-sodium). Canola, safflower, olive, soybean, and sunflower oils. Avocado. Seasoning and other foods Herbs. Spices. Seasoning mixes without salt. Unsalted popcorn and pretzels. Fat-free sweets. What foods are not recommended? The items listed may not be a complete list. Talk with your dietitian about what dietary choices are best for you. Grains Baked goods made with fat, such as croissants, muffins, or some breads. Dry pasta or rice meal packs. Vegetables Creamed or fried vegetables. Vegetables in a cheese sauce. Regular canned   vegetables (not low-sodium or reduced-sodium). Regular canned tomato sauce and paste (not low-sodium or reduced-sodium). Regular tomato and vegetable juice (not low-sodium or reduced-sodium). Pickles.  Olives. Fruits Canned fruit in a light or heavy syrup. Fried fruit. Fruit in cream or butter sauce. Meat and other protein foods Fatty cuts of meat. Ribs. Fried meat. Bacon. Sausage. Bologna and other processed lunch meats. Salami. Fatback. Hotdogs. Bratwurst. Salted nuts and seeds. Canned beans with added salt. Canned or smoked fish. Whole eggs or egg yolks. Chicken or turkey with skin. Dairy Whole or 2% milk, cream, and half-and-half. Whole or full-fat cream cheese. Whole-fat or sweetened yogurt. Full-fat cheese. Nondairy creamers. Whipped toppings. Processed cheese and cheese spreads. Fats and oils Butter. Stick margarine. Lard. Shortening. Ghee. Bacon fat. Tropical oils, such as coconut, palm kernel, or palm oil. Seasoning and other foods Salted popcorn and pretzels. Onion salt, garlic salt, seasoned salt, table salt, and sea salt. Worcestershire sauce. Tartar sauce. Barbecue sauce. Teriyaki sauce. Soy sauce, including reduced-sodium. Steak sauce. Canned and packaged gravies. Fish sauce. Oyster sauce. Cocktail sauce. Horseradish that you find on the shelf. Ketchup. Mustard. Meat flavorings and tenderizers. Bouillon cubes. Hot sauce and Tabasco sauce. Premade or packaged marinades. Premade or packaged taco seasonings. Relishes. Regular salad dressings. Where to find more information:  National Heart, Lung, and Blood Institute: www.nhlbi.nih.gov  American Heart Association: www.heart.org Summary  The DASH eating plan is a healthy eating plan that has been shown to reduce high blood pressure (hypertension). It may also reduce your risk for type 2 diabetes, heart disease, and stroke.  With the DASH eating plan, you should limit salt (sodium) intake to 2,300 mg a day. If you have hypertension, you may need to reduce your sodium intake to 1,500 mg a day.  When on the DASH eating plan, aim to eat more fresh fruits and vegetables, whole grains, lean proteins, low-fat dairy, and heart-healthy  fats.  Work with your health care provider or diet and nutrition specialist (dietitian) to adjust your eating plan to your individual calorie needs. This information is not intended to replace advice given to you by your health care provider. Make sure you discuss any questions you have with your health care provider. Document Released: 08/31/2011 Document Revised: 09/04/2016 Document Reviewed: 09/04/2016 [employer] Interactive Patient Education  2018 [employer]  

## 2018-04-23 ENCOUNTER — Encounter (INDEPENDENT_AMBULATORY_CARE_PROVIDER_SITE_OTHER): Payer: Self-pay

## 2018-04-23 LAB — PAP, TP IMAGING W/ HPV RNA, RFLX HPV TYPE 16,18/45: HPV DNA High Risk: NOT DETECTED

## 2018-11-07 MED FILL — AMOXICILLIN 500 MG CAPSULE: 500 | 7 days supply | Qty: 21 | Fill #0

## 2019-03-03 ENCOUNTER — Encounter: Payer: 59 | Admitting: Women's Health

## 2019-03-31 MED FILL — AMLODIPINE-OLMESARTAN 5-20: 5-20 | 30 days supply | Qty: 30 | Fill #0

## 2019-06-19 MED FILL — BISOPROLOL-HCTZ 2.5-6.25 MG: 2.5-6.25 | 30 days supply | Qty: 30 | Fill #0

## 2019-07-18 MED FILL — BISOPROLOL-HCTZ 2.5-6.25 MG: 2.5-6.25 | 30 days supply | Qty: 30 | Fill #0

## 2019-08-22 MED FILL — BISOPROLOL-HCTZ 2.5-6.25 MG: 2.5-6.25 | 30 days supply | Qty: 30 | Fill #1

## 2019-09-23 MED FILL — BISOPROLOL-HCTZ 2.5-6.25 MG: 2.5-6.25 | 30 days supply | Qty: 30 | Fill #2

## 2019-11-04 MED FILL — BISOPROLOL-HCTZ 2.5-6.25 MG: 2.5-6.25 | 90 days supply | Qty: 90 | Fill #0

## 2019-11-21 ENCOUNTER — Ambulatory Visit: Payer: 59 | Attending: Internal Medicine

## 2019-11-21 DIAGNOSIS — Z20822 Contact with and (suspected) exposure to covid-19: Secondary | ICD-10-CM | POA: Insufficient documentation

## 2019-11-22 LAB — NOVEL CORONAVIRUS, NAA: SARS-CoV-2, NAA: NOT DETECTED

## 2020-04-09 MED FILL — CYCLOBENZAPRINE HCL 5 MG TA: 5 | 10 days supply | Qty: 60 | Fill #0

## 2020-06-23 ENCOUNTER — Other Ambulatory Visit (HOSPITAL_COMMUNITY): Payer: Self-pay | Admitting: Family Medicine

## 2020-06-23 MED FILL — BISOPROLOL-HCTZ 2.5-6.25 MG: 2.5-6.25 | 90 days supply | Qty: 90 | Fill #0

## 2020-09-22 ENCOUNTER — Other Ambulatory Visit (HOSPITAL_BASED_OUTPATIENT_CLINIC_OR_DEPARTMENT_OTHER): Payer: Self-pay | Admitting: Internal Medicine

## 2020-09-22 MED FILL — OSELTAMIVIR PHOSPHATE 75 MG: 75 | 5 days supply | Qty: 10 | Fill #0

## 2020-11-08 MED FILL — BISOPROLOL-HCTZ 2.5-6.25 MG: 2.5-6.25 | 90 days supply | Qty: 90 | Fill #1

## 2021-03-18 ENCOUNTER — Other Ambulatory Visit (HOSPITAL_COMMUNITY): Payer: Self-pay

## 2021-03-18 MED ORDER — BISOPROLOL-HYDROCHLOROTHIAZIDE 2.5-6.25 MG PO TABS
ORAL_TABLET | ORAL | 3 refills | Status: DC
  Filled 2021-03-18: qty 90, 90d supply, fill #0
  Filled 2021-08-01: qty 90, 90d supply, fill #1
  Filled 2021-11-20 – 2021-12-06 (×2): qty 90, 90d supply, fill #2
  Filled 2022-03-09: qty 90, 90d supply, fill #3

## 2021-03-21 ENCOUNTER — Other Ambulatory Visit (HOSPITAL_COMMUNITY): Payer: Self-pay

## 2021-07-22 ENCOUNTER — Other Ambulatory Visit (HOSPITAL_COMMUNITY): Payer: Self-pay

## 2021-07-22 MED ORDER — ONDANSETRON 4 MG PO TBDP
ORAL_TABLET | ORAL | 0 refills | Status: AC
  Filled 2021-07-22 – 2022-04-20 (×2): qty 56, 14d supply, fill #0

## 2021-08-01 ENCOUNTER — Other Ambulatory Visit (HOSPITAL_COMMUNITY): Payer: Self-pay

## 2021-08-02 ENCOUNTER — Other Ambulatory Visit (HOSPITAL_COMMUNITY): Payer: Self-pay

## 2021-08-30 ENCOUNTER — Other Ambulatory Visit (HOSPITAL_BASED_OUTPATIENT_CLINIC_OR_DEPARTMENT_OTHER): Payer: Self-pay

## 2021-08-30 MED ORDER — MONTELUKAST SODIUM 10 MG PO TABS
ORAL_TABLET | ORAL | 3 refills | Status: AC
  Filled 2021-08-30: qty 90, 90d supply, fill #0
  Filled 2022-06-13: qty 90, 90d supply, fill #1

## 2021-11-21 ENCOUNTER — Other Ambulatory Visit (HOSPITAL_COMMUNITY): Payer: Self-pay

## 2021-11-22 ENCOUNTER — Other Ambulatory Visit (HOSPITAL_COMMUNITY): Payer: Self-pay

## 2021-11-23 ENCOUNTER — Other Ambulatory Visit (HOSPITAL_COMMUNITY): Payer: Self-pay

## 2021-12-01 ENCOUNTER — Other Ambulatory Visit (HOSPITAL_COMMUNITY): Payer: Self-pay

## 2021-12-06 ENCOUNTER — Other Ambulatory Visit (HOSPITAL_COMMUNITY): Payer: Self-pay

## 2022-02-06 ENCOUNTER — Emergency Department (HOSPITAL_BASED_OUTPATIENT_CLINIC_OR_DEPARTMENT_OTHER)
Admission: EM | Admit: 2022-02-06 | Discharge: 2022-02-06 | Disposition: A | Discharge status: Home / Self Care | Payer: 59 | Attending: Emergency Medicine | Admitting: Emergency Medicine

## 2022-02-06 ENCOUNTER — Other Ambulatory Visit: Payer: Self-pay

## 2022-02-06 ENCOUNTER — Encounter (HOSPITAL_BASED_OUTPATIENT_CLINIC_OR_DEPARTMENT_OTHER): Payer: Self-pay | Admitting: Emergency Medicine

## 2022-02-06 DIAGNOSIS — M542 Cervicalgia: Secondary | ICD-10-CM | POA: Insufficient documentation

## 2022-02-06 DIAGNOSIS — Y92410 Unspecified street and highway as the place of occurrence of the external cause: Secondary | ICD-10-CM | POA: Insufficient documentation

## 2022-02-06 DIAGNOSIS — M25511 Pain in right shoulder: Secondary | ICD-10-CM | POA: Diagnosis not present

## 2022-02-06 MED ORDER — METHOCARBAMOL 500 MG PO TABS: 500.0000 mg | ORAL_TABLET | Freq: Two times a day (BID) | ORAL | 0 refills | Status: AC
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
## 2022-02-06 NOTE — Discharge Instructions (Signed)

## 2022-02-24 ENCOUNTER — Other Ambulatory Visit (HOSPITAL_BASED_OUTPATIENT_CLINIC_OR_DEPARTMENT_OTHER): Payer: Self-pay

## 2022-02-24 MED ORDER — TRETINOIN 0.1 % EX CREA
TOPICAL_CREAM | CUTANEOUS | 1 refills | Status: AC
  Filled 2022-02-24: qty 20, 15d supply, fill #0
  Filled 2022-04-20: qty 20, 15d supply, fill #1

## 2022-02-27 ENCOUNTER — Other Ambulatory Visit (HOSPITAL_BASED_OUTPATIENT_CLINIC_OR_DEPARTMENT_OTHER): Payer: Self-pay

## 2022-02-28 ENCOUNTER — Other Ambulatory Visit (HOSPITAL_BASED_OUTPATIENT_CLINIC_OR_DEPARTMENT_OTHER): Payer: Self-pay

## 2022-03-02 ENCOUNTER — Other Ambulatory Visit (HOSPITAL_BASED_OUTPATIENT_CLINIC_OR_DEPARTMENT_OTHER): Payer: Self-pay

## 2022-03-03 ENCOUNTER — Other Ambulatory Visit (HOSPITAL_BASED_OUTPATIENT_CLINIC_OR_DEPARTMENT_OTHER): Payer: Self-pay

## 2022-03-06 ENCOUNTER — Other Ambulatory Visit (HOSPITAL_BASED_OUTPATIENT_CLINIC_OR_DEPARTMENT_OTHER): Payer: Self-pay

## 2022-03-07 ENCOUNTER — Other Ambulatory Visit (HOSPITAL_BASED_OUTPATIENT_CLINIC_OR_DEPARTMENT_OTHER): Payer: Self-pay

## 2022-03-09 ENCOUNTER — Other Ambulatory Visit (HOSPITAL_BASED_OUTPATIENT_CLINIC_OR_DEPARTMENT_OTHER): Payer: Self-pay

## 2022-03-10 ENCOUNTER — Other Ambulatory Visit (HOSPITAL_BASED_OUTPATIENT_CLINIC_OR_DEPARTMENT_OTHER): Payer: Self-pay

## 2022-03-13 ENCOUNTER — Other Ambulatory Visit (HOSPITAL_BASED_OUTPATIENT_CLINIC_OR_DEPARTMENT_OTHER): Payer: Self-pay

## 2022-03-24 DIAGNOSIS — E0590 Thyrotoxicosis, unspecified without thyrotoxic crisis or storm: Secondary | ICD-10-CM | POA: Diagnosis not present

## 2022-03-24 DIAGNOSIS — Z0000 Encounter for general adult medical examination without abnormal findings: Secondary | ICD-10-CM | POA: Diagnosis not present

## 2022-03-24 DIAGNOSIS — E559 Vitamin D deficiency, unspecified: Secondary | ICD-10-CM | POA: Diagnosis not present

## 2022-03-24 DIAGNOSIS — I10 Essential (primary) hypertension: Secondary | ICD-10-CM | POA: Diagnosis not present

## 2022-03-24 DIAGNOSIS — R7303 Prediabetes: Secondary | ICD-10-CM | POA: Diagnosis not present

## 2022-03-30 ENCOUNTER — Other Ambulatory Visit (HOSPITAL_BASED_OUTPATIENT_CLINIC_OR_DEPARTMENT_OTHER): Payer: Self-pay

## 2022-04-20 ENCOUNTER — Other Ambulatory Visit (HOSPITAL_COMMUNITY): Payer: Self-pay

## 2022-05-31 DIAGNOSIS — E0590 Thyrotoxicosis, unspecified without thyrotoxic crisis or storm: Secondary | ICD-10-CM | POA: Diagnosis not present

## 2022-05-31 DIAGNOSIS — I10 Essential (primary) hypertension: Secondary | ICD-10-CM | POA: Diagnosis not present

## 2022-06-01 DIAGNOSIS — E0590 Thyrotoxicosis, unspecified without thyrotoxic crisis or storm: Secondary | ICD-10-CM | POA: Diagnosis not present

## 2022-06-13 ENCOUNTER — Other Ambulatory Visit (HOSPITAL_BASED_OUTPATIENT_CLINIC_OR_DEPARTMENT_OTHER): Payer: Self-pay

## 2022-06-14 ENCOUNTER — Other Ambulatory Visit (HOSPITAL_BASED_OUTPATIENT_CLINIC_OR_DEPARTMENT_OTHER): Payer: Self-pay

## 2022-06-14 MED ORDER — BISOPROLOL-HYDROCHLOROTHIAZIDE 2.5-6.25 MG PO TABS
1.0000 | ORAL_TABLET | Freq: Every day | ORAL | 3 refills | Status: AC
  Filled 2022-06-14: qty 90, 90d supply, fill #0
  Filled 2022-09-13: qty 90, 90d supply, fill #1
  Filled 2022-12-28: qty 90, 90d supply, fill #2

## 2022-06-15 ENCOUNTER — Other Ambulatory Visit (HOSPITAL_BASED_OUTPATIENT_CLINIC_OR_DEPARTMENT_OTHER): Payer: Self-pay

## 2022-06-21 ENCOUNTER — Other Ambulatory Visit (HOSPITAL_COMMUNITY): Payer: Self-pay | Admitting: Family Medicine

## 2022-06-21 DIAGNOSIS — E0590 Thyrotoxicosis, unspecified without thyrotoxic crisis or storm: Secondary | ICD-10-CM

## 2022-06-29 ENCOUNTER — Encounter (HOSPITAL_COMMUNITY)
Admission: RE | Admit: 2022-06-29 | Discharge: 2022-06-29 | Disposition: A | Discharge status: Home / Self Care | Payer: 59 | Source: Ambulatory Visit | Attending: Family Medicine | Admitting: Family Medicine

## 2022-06-29 DIAGNOSIS — E0590 Thyrotoxicosis, unspecified without thyrotoxic crisis or storm: Secondary | ICD-10-CM | POA: Diagnosis not present

## 2022-06-29 MED ORDER — SODIUM IODIDE I-123 7.4 MBQ CAPS
428.0000 | ORAL_CAPSULE | Freq: Once | ORAL | Status: AC
  Administered 2022-06-29: 428 via ORAL

## 2022-06-30 ENCOUNTER — Encounter (HOSPITAL_COMMUNITY)
Admission: RE | Admit: 2022-06-30 | Discharge: 2022-06-30 | Disposition: A | Discharge status: Home / Self Care | Payer: 59 | Source: Ambulatory Visit | Attending: Family Medicine | Admitting: Family Medicine

## 2022-07-05 DIAGNOSIS — E0590 Thyrotoxicosis, unspecified without thyrotoxic crisis or storm: Secondary | ICD-10-CM | POA: Diagnosis not present

## 2022-07-05 DIAGNOSIS — R634 Abnormal weight loss: Secondary | ICD-10-CM | POA: Diagnosis not present

## 2022-07-05 DIAGNOSIS — R000 Tachycardia, unspecified: Secondary | ICD-10-CM | POA: Diagnosis not present

## 2022-07-05 DIAGNOSIS — R251 Tremor, unspecified: Secondary | ICD-10-CM | POA: Diagnosis not present

## 2022-07-06 ENCOUNTER — Other Ambulatory Visit: Payer: Self-pay | Admitting: Family Medicine

## 2022-07-06 DIAGNOSIS — E0590 Thyrotoxicosis, unspecified without thyrotoxic crisis or storm: Secondary | ICD-10-CM

## 2022-07-10 ENCOUNTER — Ambulatory Visit
Admission: RE | Admit: 2022-07-10 | Discharge: 2022-07-10 | Disposition: A | Discharge status: Home / Self Care | Payer: 59 | Source: Ambulatory Visit | Attending: Family Medicine | Admitting: Family Medicine

## 2022-07-10 DIAGNOSIS — E041 Nontoxic single thyroid nodule: Secondary | ICD-10-CM | POA: Diagnosis not present

## 2022-07-10 DIAGNOSIS — E010 Iodine-deficiency related diffuse (endemic) goiter: Secondary | ICD-10-CM | POA: Diagnosis not present

## 2022-07-10 DIAGNOSIS — E0590 Thyrotoxicosis, unspecified without thyrotoxic crisis or storm: Secondary | ICD-10-CM

## 2022-07-13 ENCOUNTER — Other Ambulatory Visit: Payer: Self-pay | Admitting: Family Medicine

## 2022-07-13 DIAGNOSIS — E041 Nontoxic single thyroid nodule: Secondary | ICD-10-CM

## 2022-08-30 ENCOUNTER — Other Ambulatory Visit (HOSPITAL_COMMUNITY)
Admission: RE | Admit: 2022-08-30 | Discharge: 2022-08-30 | Disposition: A | Discharge status: Home / Self Care | Payer: 59 | Source: Ambulatory Visit | Attending: Family Medicine | Admitting: Family Medicine

## 2022-08-30 ENCOUNTER — Ambulatory Visit
Admission: RE | Admit: 2022-08-30 | Discharge: 2022-08-30 | Disposition: A | Discharge status: Home / Self Care | Payer: 59 | Source: Ambulatory Visit | Attending: Family Medicine | Admitting: Family Medicine

## 2022-08-30 DIAGNOSIS — E041 Nontoxic single thyroid nodule: Secondary | ICD-10-CM | POA: Insufficient documentation

## 2022-09-04 LAB — CYTOLOGY - NON PAP

## 2022-09-13 ENCOUNTER — Other Ambulatory Visit (HOSPITAL_BASED_OUTPATIENT_CLINIC_OR_DEPARTMENT_OTHER): Payer: Self-pay

## 2022-09-13 ENCOUNTER — Other Ambulatory Visit (HOSPITAL_COMMUNITY): Payer: Self-pay

## 2022-09-13 MED ORDER — BISOPROLOL-HYDROCHLOROTHIAZIDE 2.5-6.25 MG PO TABS
1.0000 | ORAL_TABLET | Freq: Every day | ORAL | 1 refills | Status: AC
  Filled 2022-09-13: qty 90, 90d supply, fill #0

## 2022-09-14 ENCOUNTER — Other Ambulatory Visit (HOSPITAL_BASED_OUTPATIENT_CLINIC_OR_DEPARTMENT_OTHER): Payer: Self-pay

## 2022-09-14 ENCOUNTER — Other Ambulatory Visit (HOSPITAL_COMMUNITY): Payer: Self-pay

## 2022-09-15 ENCOUNTER — Other Ambulatory Visit (HOSPITAL_BASED_OUTPATIENT_CLINIC_OR_DEPARTMENT_OTHER): Payer: Self-pay

## 2022-09-26 ENCOUNTER — Other Ambulatory Visit (HOSPITAL_BASED_OUTPATIENT_CLINIC_OR_DEPARTMENT_OTHER): Payer: Self-pay

## 2022-09-26 DIAGNOSIS — I10 Essential (primary) hypertension: Secondary | ICD-10-CM | POA: Diagnosis not present

## 2022-09-26 DIAGNOSIS — E559 Vitamin D deficiency, unspecified: Secondary | ICD-10-CM | POA: Diagnosis not present

## 2022-09-26 DIAGNOSIS — E0500 Thyrotoxicosis with diffuse goiter without thyrotoxic crisis or storm: Secondary | ICD-10-CM | POA: Diagnosis not present

## 2022-09-26 DIAGNOSIS — E041 Nontoxic single thyroid nodule: Secondary | ICD-10-CM | POA: Diagnosis not present

## 2022-09-26 MED ORDER — METHIMAZOLE 10 MG PO TABS
10.0000 mg | ORAL_TABLET | Freq: Every day | ORAL | 1 refills | Status: DC
  Filled 2022-09-26: qty 30, 30d supply, fill #0

## 2022-11-02 ENCOUNTER — Ambulatory Visit: Payer: Self-pay | Admitting: Surgery

## 2022-11-02 DIAGNOSIS — E0500 Thyrotoxicosis with diffuse goiter without thyrotoxic crisis or storm: Secondary | ICD-10-CM | POA: Diagnosis not present

## 2022-11-02 DIAGNOSIS — E041 Nontoxic single thyroid nodule: Secondary | ICD-10-CM | POA: Diagnosis not present

## 2022-11-10 NOTE — Progress Notes (Signed)
COVID Vaccine received:  []$  No [x]$  Yes Date of any COVID positive Test in last 90 days:  none  PCP - [name], DO Cardiologist - None Endocrinology- [name], MD  Chest x-ray -  EKG -   day of PST Stress Test -  ECHO -  Cardiac Cath -   PCR screen: []$  Ordered & Completed                      []$   No Order but Needs PROFEND                      [x]$   N/A for this surgery  Surgery Plan:  []$  Ambulatory                            [x]$  Outpatient in bed                            []$  Admit  Anesthesia:    [x]$  General  []$  Spinal                           []$   Choice []$   MAC  Pacemaker / ICD device [x]$  No []$  Yes        Device order form faxed [x]$  No    []$   Yes      Faxed to:  Spinal Cord Stimulator:[x]$  No []$  Yes      (Remind patient to bring remote DOS) Other Implants:   History of Sleep Apnea? [x]$  No []$  Yes   CPAP used?- [x]$  No []$  Yes    Does the patient monitor blood sugar? []$  No []$  Yes  [x]$  N/A  No DM    Blood Thinner / Instructions:  none Aspirin Instructions:  none  ERAS Protocol Ordered: []$  No  [x]$  Yes PRE-SURGERY []$  ENSURE  []$  G2  [x]$  No Drink Ordered Patient is to be NPO after: 04:30 am  Comments: Patient works in Building services engineer at [employer].   Activity level: Patient can climb a flight of stairs without difficulty; [x]$  No CP  [x]$  No SOB.  Patient can perform ADLs without assistance.   Anesthesia review: HTN, Grave's disease.  Patient denies shortness of breath, fever, cough and chest pain at PAT appointment.  Patient verbalized understanding and agreement to the Pre-Surgical Instructions that were given to them at this PAT appointment. Patient was also educated of the need to review these PAT instructions again prior to his/her surgery.I reviewed the appropriate phone numbers to call if they have any and questions or concerns.

## 2022-11-10 NOTE — Patient Instructions (Signed)
SURGICAL WAITING ROOM VISITATION Patients having surgery or a procedure may have no more than 2 support people in the waiting area - these visitors may rotate in the visitor waiting room.   Due to an increase in RSV and influenza rates and associated hospitalizations, children ages 27 and under may not visit patients in [place]. If the patient needs to stay at the hospital during part of their recovery, the visitor guidelines for inpatient rooms apply.  PRE-OP VISITATION  Pre-op nurse will coordinate an appropriate time for 1 support person to accompany the patient in pre-op.  This support person may not rotate.  This visitor will be contacted when the time is appropriate for the visitor to come back in the pre-op area.  Please refer to the [facility] website for the visitor guidelines for Inpatients (after your surgery is over and you are in a regular room).  You are not required to quarantine at this time prior to your surgery. However, you must do this: Hand Hygiene often Do NOT share personal items Notify your provider if you are in close contact with someone who has COVID or you develop fever 100.4 or greater, new onset of sneezing, cough, sore throat, shortness of breath or body aches.  If you test positive for Covid or have been in contact with anyone that has tested positive in the last 10 days please notify you surgeon.    Your procedure is scheduled on:  Monday November 27, 2022  Report to [facility] Main Entrance: [address] entrance where the [employer] is available.   Report to admitting at: 05:15    AM  +++++Call this number if you have any questions or problems the morning of surgery [phone]  Do not eat food after Midnight the night prior to your surgery/procedure.  After Midnight you may have the following liquids until    04:30 AM DAY OF SURGERY  Clear Liquid Diet Water Black Coffee (sugar ok, NO MILK/CREAM OR CREAMERS)  Tea (sugar ok, NO  MILK/CREAM OR CREAMERS) regular and decaf                             Plain Jell-O  with no fruit (NO RED)                                           Fruit ices (not with fruit pulp, NO RED)                                     Popsicles (NO RED)                                                                  Juice: apple, WHITE grape, WHITE cranberry Sports drinks like Gatorade or Powerade (NO RED)              FOLLOW ANY ADDITIONAL PRE OP INSTRUCTIONS YOU RECEIVED FROM YOUR SURGEON'S OFFICE!!!   Oral Hygiene is also important to reduce your risk of infection.  Remember - BRUSH YOUR TEETH THE MORNING OF SURGERY WITH YOUR REGULAR TOOTHPASTE  Take ONLY these medicines the morning of surgery with A SIP OF WATER: Montelukast (Singulair), Methimazole (Tapazole)                    You may not have any metal on your body including hair pins, jewelry, and body piercing  Do not wear make-up, lotions, powders, perfumes or deodorant  Do not wear nail polish including gel and S&S, artificial / acrylic nails, or any other type of covering on natural nails including finger and toenails. If you have artificial nails, gel coating, etc., that needs to be removed by a nail salon, Please have this removed prior to surgery. Not doing so may mean that your surgery could be cancelled or delayed if the Surgeon or anesthesia staff feels like they are unable to monitor you safely.   Do not shave 48 hours prior to surgery to avoid nicks in your skin which may contribute to postoperative infections.   You may bring a small overnight bag with you on the day of surgery, only pack items that are not valuable. [facility] IS NOT RESPONSIBLE   FOR VALUABLES THAT ARE LOST OR STOLEN.    Do not bring your home medications to the hospital. The Pharmacy will dispense medications listed on your medication list to you during your admission in the Hospital.  Special Instructions: Bring a copy of your healthcare power of  attorney and living will documents the day of surgery, if you wish to have them scanned into your [facility] Records- EPIC  Please read over the following fact sheets you were given: IF YOU HAVE QUESTIONS ABOUT YOUR PRE-OP INSTRUCTIONS, PLEASE CALL [phone]  ([place])   [facility] - Preparing for Surgery Before surgery, you can play an important role.  Because skin is not sterile, your skin needs to be as free of germs as possible.  You can reduce the number of germs on your skin by washing with CHG (chlorahexidine gluconate) soap before surgery.  CHG is an antiseptic cleaner which kills germs and bonds with the skin to continue killing germs even after washing. Please DO NOT use if you have an allergy to CHG or antibacterial soaps.  If your skin becomes reddened/irritated stop using the CHG and inform your nurse when you arrive at Short Stay. Do not shave (including legs and underarms) for at least 48 hours prior to the first CHG shower.  You may shave your face/neck.  Please follow these instructions carefully:  1.  Shower with CHG Soap the night before surgery and the  morning of surgery.  2.  If you choose to wash your hair, wash your hair first as usual with your normal  shampoo.  3.  After you shampoo, rinse your hair and body thoroughly to remove the shampoo.                             4.  Use CHG as you would any other liquid soap.  You can apply chg directly to the skin and wash.  Gently with a scrungie or clean washcloth.  5.  Apply the CHG Soap to your body ONLY FROM THE NECK DOWN.   Do not use on face/ open  Wound or open sores. Avoid contact with eyes, ears mouth and genitals (private parts).                       Wash face,  Genitals (private parts) with your normal soap.             6.  Wash thoroughly, paying special attention to the area where your  surgery  will be performed.  7.  Thoroughly rinse your body with warm water from the neck  down.  8.  DO NOT shower/wash with your normal soap after using and rinsing off the CHG Soap.            9.  Pat yourself dry with a clean towel.            10.  Wear clean pajamas.            11.  Place clean sheets on your bed the night of your first shower and do not  sleep with pets.  ON THE DAY OF SURGERY : Do not apply any lotions/deodorants the morning of surgery.  Please wear clean clothes to the hospital/surgery center.    FAILURE TO FOLLOW THESE INSTRUCTIONS MAY RESULT IN THE CANCELLATION OF YOUR SURGERY  PATIENT SIGNATURE_________________________________  NURSE SIGNATURE__________________________________  ________________________________________________________________________

## 2022-11-13 ENCOUNTER — Other Ambulatory Visit: Payer: Self-pay

## 2022-11-13 ENCOUNTER — Encounter (HOSPITAL_COMMUNITY)
Admission: RE | Admit: 2022-11-13 | Discharge: 2022-11-13 | Disposition: A | Discharge status: Home / Self Care | Payer: 59 | Source: Ambulatory Visit | Attending: Surgery | Admitting: Surgery

## 2022-11-13 ENCOUNTER — Encounter (HOSPITAL_COMMUNITY): Payer: Self-pay

## 2022-11-13 VITALS — BP 116/76 | HR 64 | Temp 98.8°F | Resp 14 | Ht 69.0 in | Wt 167.0 lb

## 2022-11-13 DIAGNOSIS — Z01818 Encounter for other preprocedural examination: Secondary | ICD-10-CM | POA: Insufficient documentation

## 2022-11-13 DIAGNOSIS — R001 Bradycardia, unspecified: Secondary | ICD-10-CM | POA: Diagnosis not present

## 2022-11-13 DIAGNOSIS — I10 Essential (primary) hypertension: Secondary | ICD-10-CM

## 2022-11-13 HISTORY — DX: Nausea with vomiting, unspecified: R11.2

## 2022-11-13 HISTORY — DX: Other specified postprocedural states: Z98.890

## 2022-11-13 HISTORY — DX: Thyrotoxicosis, unspecified without thyrotoxic crisis or storm: E05.90

## 2022-11-13 LAB — BASIC METABOLIC PANEL
Anion gap: 8 (ref 5–15)
BUN: 10 mg/dL (ref 6–20)
CO2: 24 mmol/L (ref 22–32)
Calcium: 9 mg/dL (ref 8.9–10.3)
Chloride: 108 mmol/L (ref 98–111)
Creatinine, Ser: 0.78 mg/dL (ref 0.44–1.00)
GFR, Estimated: >60 mL/min (ref >60)
Glucose, Bld: 104 mg/dL — ABNORMAL HIGH (ref 70–99)
Potassium: 3.9 mmol/L (ref 3.5–5.1)
Sodium: 140 mmol/L (ref 135–145)

## 2022-11-13 LAB — CBC
HCT: 40.4 % (ref 36.0–46.0)
Hemoglobin: 13.4 g/dL (ref 12.0–15.0)
MCH: 30 pg (ref 26.0–34.0)
MCHC: 33.2 g/dL (ref 30.0–36.0)
MCV: 90.6 fL (ref 80.0–100.0)
Platelets: 268 10*3/uL (ref 150–400)
RBC: 4.46 MIL/uL (ref 3.87–5.11)
RDW: 14.2 % (ref 11.5–15.5)
WBC: 6.1 10*3/uL (ref 4.0–10.5)
nRBC: 0 % (ref 0.0–0.2)

## 2022-11-14 ENCOUNTER — Other Ambulatory Visit (HOSPITAL_COMMUNITY): Payer: Self-pay

## 2022-11-25 ENCOUNTER — Encounter (HOSPITAL_COMMUNITY): Payer: Self-pay | Admitting: Surgery

## 2022-11-25 DIAGNOSIS — E0500 Thyrotoxicosis with diffuse goiter without thyrotoxic crisis or storm: Secondary | ICD-10-CM | POA: Diagnosis present

## 2022-11-25 DIAGNOSIS — E041 Nontoxic single thyroid nodule: Secondary | ICD-10-CM | POA: Diagnosis present

## 2022-11-25 NOTE — H&P (Signed)
REFERRING PHYSICIAN: Birdena Crandall, MD  PROVIDER: Ziyad Lucero Charlotta Newton, MD   Chief Complaint: New Consultation [name]' disease, thyroid nodule)  History of Present Illness:  Patient is referred by Dr. Jacelyn Lucero for surgical evaluation and management of hyperthyroidism and thyroid nodule. Patient first developed symptoms of hyperthyroidism approximately 1 year ago. This included tremors and palpitations. Patient was recently evaluated by her primary care physician, Dr. Janie Lucero, and noted to have abnormal thyroid function tests. Patient underwent a nuclear medicine thyroid uptake scan on June 29, 2022. This demonstrated a pattern consistent with hyperthyroidism, Graves' disease, with a cold nodule in the inferior right thyroid lobe. Ultrasound examination of the thyroid was performed on July 10, 2022. This demonstrated a slightly enlarged right thyroid lobe and a normal-sized left thyroid lobe. There appeared to be pseudo nodules as well as a single dominant nodule in the inferior right thyroid lobe measuring 2.5 cm in size. This had shown interval increase in size compared to her prior study. She underwent ultrasound-guided fine-needle aspiration biopsy on August 30, 2022. Cytopathology was consistent with a benign follicular nodule, Bethesda category II. Patient presents today on referral for evaluation for thyroidectomy for treatment of hyperthyroidism from Graves' disease as well as a benign cold thyroid nodule. Patient had discussed treatment with radioactive iodine with Dr. Jacelyn Lucero and opted for the surgical approach. Patient is taking a beta-blocker. She has not been taking methimazole because she is concerned about side effects. Patient has had no prior head or neck surgery. She has never been on thyroid medication. There is a family history of medical thyroid disease on her mother side of the family. There is no family history of thyroid malignancy. 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 I have reviewed this information and discussed as appropriate with the patient. See HPI as well for other ROS.  Review of Systems  Constitutional: Negative. Negative for weight loss.  HENT: Negative.  Eyes: Negative.  Respiratory: Negative.  Cardiovascular: Positive for palpitations.  Gastrointestinal: Negative.  Genitourinary: Negative.  Musculoskeletal: Negative.  Skin: Negative.  Neurological: Positive for tremors.  Endo/Heme/Allergies: Negative.  Psychiatric/Behavioral: Negative.    Medical History: Past Medical History:  Diagnosis Date  Hypertension  Thyroid disease   Patient Active Problem List  Diagnosis  Dichorionic diamniotic twin pregnancy in third trimester  Pregnancy resulting from in vitro fertilization in third trimester  Graves' disease  Thyroid nodule, cold   Past Surgical History:  Procedure Laterality Date  CESAREAN SECTION  MYOMECTOMY ABDOMINAL    Allergies  Allergen Reactions  Amlodipine-Olmesartan Hives  Doxycycline Rash   Current Outpatient Medications on File Prior to Visit  Medication Sig Dispense Refill  bisoproloL-hydroCHLOROthiazide (ZIAC) 2.5-6.25 mg tablet Take 1 tablet by mouth once daily  cholecalciferol (VITAMIN D3) 1000 unit capsule Take 1,000 Units by mouth once daily  vitamin E 400 UNIT capsule Take 180 Units by mouth once daily  prenatal vitamin-iron-FA-DHA (PRENATE DHA) 27 mg iron-1 mg -300 mg capsule Take 1 capsule by mouth once daily. (Patient not taking: Reported on 11/02/2022)   No current facility-administered medications on file prior to visit.   Family History  Problem Relation Age of Onset  Obesity Mother  High blood pressure (Hypertension) Mother  Hyperlipidemia (Elevated cholesterol) Mother  Diabetes Mother    Social History   Tobacco Use  Smoking Status Never  Smokeless Tobacco  Never  Social History   Socioeconomic History  Marital status: Married  Tobacco Use  Smoking status: Never  Smokeless tobacco: Never  Substance and Sexual Activity  Alcohol use: Yes  Comment: once a week  Drug use: Never   Objective:   Vitals:  BP: (!) 152/93  Pulse: 86  Temp: 36.1 C (97 F)  SpO2: 99%  Weight: 77.7 kg (171 lb 6.4 oz)  Height: 170.2 cm ('5\' 7"'$ )   Body mass index is 26.85 kg/m.  Physical Exam   GENERAL APPEARANCE Comfortable, no acute issues Development: normal Gross deformities: none  SKIN Rash, lesions, ulcers: none Induration, erythema: none Nodules: none palpable  EYES Conjunctiva and lids: normal Pupils: equal and reactive  EARS, NOSE, MOUTH, THROAT External ears: no lesion or deformity External nose: no lesion or deformity Hearing: grossly normal  NECK Symmetric: yes Trachea: midline Thyroid: There is a palpable smooth approximately 2 cm nodule in the inferior right thyroid lobe. Left thyroid lobe is without palpable abnormality. There is no associated lymphadenopathy. There is no tenderness.  CHEST Respiratory effort: normal Retraction or accessory muscle use: no Breath sounds: normal bilaterally Rales, rhonchi, wheeze: none  CARDIOVASCULAR Auscultation: regular rhythm, normal rate Murmurs: none Pulses: radial pulse 2+ palpable Lower extremity edema: none  ABDOMEN Not assessed  GENITOURINARY/RECTAL Not assessed  MUSCULOSKELETAL Station and gait: normal Digits and nails: no clubbing or cyanosis Muscle strength: grossly normal all extremities Range of motion: grossly normal all extremities Deformity: none  LYMPHATIC Cervical: none palpable Supraclavicular: none palpable  PSYCHIATRIC Oriented to person, place, and time: yes Mood and affect: normal for situation Judgment and insight: appropriate for situation   Assessment and Plan:   Graves' disease Thyroid nodule, cold  Patient is referred by her  endocrinologist for surgical evaluation and management of hyperthyroidism and a cold thyroid nodule.  Patient provided with a copy of "The Thyroid Book: Medical and Surgical Treatment of Thyroid Problems", published by [employer], 16 pages. Book reviewed and explained to patient during visit today.  Today we reviewed her clinical history as well as her imaging studies and cytopathology results. We discussed options for management including radioactive iodine ablation versus proceeding with thyroid surgery. We discussed the surgical option of total thyroidectomy as the procedure of choice. We discussed the risk and benefits of thyroid surgery including the risk of recurrent laryngeal nerve injury and injury to parathyroid glands. We discussed the need for lifelong thyroid hormone replacement. We discussed the size and location of the surgical incision. We discussed the hospital stay to be anticipated in her postoperative recovery and returned to work and activities. The patient understands and wishes to proceed with surgery in the near future.   Armandina Gemma, MD [facility] [place] practice Office: [phone]

## 2022-11-27 ENCOUNTER — Ambulatory Visit (HOSPITAL_COMMUNITY)
Admission: RE | Admit: 2022-11-27 | Discharge: 2022-11-28 | Disposition: A | Discharge status: Home / Self Care | Payer: 59 | Source: Ambulatory Visit | Attending: Surgery | Admitting: Surgery

## 2022-11-27 ENCOUNTER — Ambulatory Visit (HOSPITAL_BASED_OUTPATIENT_CLINIC_OR_DEPARTMENT_OTHER): Payer: 59 | Admitting: Registered Nurse

## 2022-11-27 ENCOUNTER — Other Ambulatory Visit: Payer: Self-pay

## 2022-11-27 ENCOUNTER — Encounter (HOSPITAL_COMMUNITY): Payer: Self-pay | Admitting: Surgery

## 2022-11-27 ENCOUNTER — Ambulatory Visit (HOSPITAL_COMMUNITY): Payer: 59 | Admitting: Registered Nurse

## 2022-11-27 ENCOUNTER — Encounter (HOSPITAL_COMMUNITY)
Admission: RE | Disposition: A | Discharge status: Home / Self Care | Payer: Self-pay | Source: Ambulatory Visit | Attending: Surgery

## 2022-11-27 DIAGNOSIS — E041 Nontoxic single thyroid nodule: Secondary | ICD-10-CM

## 2022-11-27 DIAGNOSIS — E0500 Thyrotoxicosis with diffuse goiter without thyrotoxic crisis or storm: Secondary | ICD-10-CM | POA: Diagnosis present

## 2022-11-27 DIAGNOSIS — E063 Autoimmune thyroiditis: Secondary | ICD-10-CM | POA: Insufficient documentation

## 2022-11-27 DIAGNOSIS — I10 Essential (primary) hypertension: Secondary | ICD-10-CM | POA: Insufficient documentation

## 2022-11-27 DIAGNOSIS — D34 Benign neoplasm of thyroid gland: Secondary | ICD-10-CM | POA: Insufficient documentation

## 2022-11-27 DIAGNOSIS — Z8349 Family history of other endocrine, nutritional and metabolic diseases: Secondary | ICD-10-CM | POA: Diagnosis not present

## 2022-11-27 DIAGNOSIS — E0590 Thyrotoxicosis, unspecified without thyrotoxic crisis or storm: Secondary | ICD-10-CM

## 2022-11-27 DIAGNOSIS — Z79899 Other long term (current) drug therapy: Secondary | ICD-10-CM | POA: Diagnosis not present

## 2022-11-27 HISTORY — PX: THYROIDECTOMY: SHX17

## 2022-11-27 LAB — POCT PREGNANCY, URINE: Preg Test, Ur: NEGATIVE

## 2022-11-27 SURGERY — THYROIDECTOMY
Anesthesia: General

## 2022-11-27 MED ORDER — BISOPROLOL-HYDROCHLOROTHIAZIDE 2.5-6.25 MG PO TABS: 1.0000 | ORAL_TABLET | Freq: Every day | ORAL | Status: DC

## 2022-11-27 MED ORDER — ONDANSETRON HCL 4 MG/2ML IJ SOLN
4.0000 mg | Freq: Four times a day (QID) | INTRAMUSCULAR | Status: DC | PRN
  Administered 2022-11-27: 4 mg via INTRAVENOUS
  Filled 2022-11-27: qty 2

## 2022-11-27 MED ORDER — ROCURONIUM BROMIDE 10 MG/ML (PF) SYRINGE
PREFILLED_SYRINGE | INTRAVENOUS | Status: AC
  Filled 2022-11-27: qty 10

## 2022-11-27 MED ORDER — FENTANYL CITRATE (PF) 100 MCG/2ML IJ SOLN
INTRAMUSCULAR | Status: DC | PRN
  Administered 2022-11-27 (×4): 50 ug via INTRAVENOUS

## 2022-11-27 MED ORDER — LACTATED RINGERS IV SOLN
INTRAVENOUS | Status: DC
  Administered 2022-11-27: 1000 mL via INTRAVENOUS

## 2022-11-27 MED ORDER — PROPOFOL 1000 MG/100ML IV EMUL
INTRAVENOUS | Status: AC
  Filled 2022-11-27: qty 100

## 2022-11-27 MED ORDER — HEMOSTATIC AGENTS (NO CHARGE) OPTIME
TOPICAL | Status: DC | PRN
  Administered 2022-11-27: 1 via TOPICAL

## 2022-11-27 MED ORDER — CHLORHEXIDINE GLUCONATE CLOTH 2 % EX PADS: 6.0000 | MEDICATED_PAD | Freq: Once | CUTANEOUS | Status: DC

## 2022-11-27 MED ORDER — HYDROMORPHONE HCL 1 MG/ML IJ SOLN: 1.0000 mg | INTRAMUSCULAR | Status: DC | PRN

## 2022-11-27 MED ORDER — TRAMADOL HCL 50 MG PO TABS: 50.0000 mg | ORAL_TABLET | Freq: Four times a day (QID) | ORAL | Status: DC | PRN

## 2022-11-27 MED ORDER — MEPERIDINE HCL 50 MG/ML IJ SOLN: 6.2500 mg | INTRAMUSCULAR | Status: DC | PRN

## 2022-11-27 MED ORDER — LIDOCAINE 2% (20 MG/ML) 5 ML SYRINGE
INTRAMUSCULAR | Status: DC | PRN
  Administered 2022-11-27: 60 mg via INTRAVENOUS

## 2022-11-27 MED ORDER — HYDROMORPHONE HCL 1 MG/ML IJ SOLN
0.2500 mg | INTRAMUSCULAR | Status: DC | PRN
  Administered 2022-11-27: 0.25 mg via INTRAVENOUS
  Administered 2022-11-27: 0.5 mg via INTRAVENOUS

## 2022-11-27 MED ORDER — SCOPOLAMINE 1 MG/3DAYS TD PT72
MEDICATED_PATCH | TRANSDERMAL | Status: AC
  Filled 2022-11-27: qty 1

## 2022-11-27 MED ORDER — CHLORHEXIDINE GLUCONATE 0.12 % MT SOLN
15.0000 mL | Freq: Once | OROMUCOSAL | Status: AC
  Administered 2022-11-27: 15 mL via OROMUCOSAL

## 2022-11-27 MED ORDER — FENTANYL CITRATE (PF) 100 MCG/2ML IJ SOLN
INTRAMUSCULAR | Status: AC
  Filled 2022-11-27: qty 2

## 2022-11-27 MED ORDER — DEXAMETHASONE SODIUM PHOSPHATE 10 MG/ML IJ SOLN
INTRAMUSCULAR | Status: DC | PRN
  Administered 2022-11-27: 10 mg via INTRAVENOUS

## 2022-11-27 MED ORDER — MIDAZOLAM HCL 5 MG/5ML IJ SOLN
INTRAMUSCULAR | Status: DC | PRN
  Administered 2022-11-27: 2 mg via INTRAVENOUS

## 2022-11-27 MED ORDER — ACETAMINOPHEN 500 MG PO TABS
ORAL_TABLET | ORAL | Status: AC
  Administered 2022-11-27: 1000 mg via ORAL
  Filled 2022-11-27: qty 2

## 2022-11-27 MED ORDER — SODIUM CHLORIDE 0.45 % IV SOLN: INTRAVENOUS | Status: DC

## 2022-11-27 MED ORDER — HYDROMORPHONE HCL 1 MG/ML IJ SOLN
INTRAMUSCULAR | Status: AC
  Administered 2022-11-27: 0.25 mg via INTRAVENOUS
  Filled 2022-11-27: qty 1

## 2022-11-27 MED ORDER — HYDROCHLOROTHIAZIDE 12.5 MG PO TABS
6.2500 mg | ORAL_TABLET | Freq: Every day | ORAL | Status: DC
  Administered 2022-11-28: 6.25 mg via ORAL
  Filled 2022-11-27: qty 1

## 2022-11-27 MED ORDER — OXYCODONE HCL 5 MG/5ML PO SOLN: 5.0000 mg | Freq: Once | ORAL | Status: AC | PRN

## 2022-11-27 MED ORDER — ORAL CARE MOUTH RINSE: 15.0000 mL | Freq: Once | OROMUCOSAL | Status: AC

## 2022-11-27 MED ORDER — PROPOFOL 10 MG/ML IV BOLUS
INTRAVENOUS | Status: DC | PRN
  Administered 2022-11-27: 130 ug/kg/min via INTRAVENOUS
  Administered 2022-11-27: 150 mg via INTRAVENOUS

## 2022-11-27 MED ORDER — ACETAMINOPHEN 500 MG PO TABS: 1000.0000 mg | ORAL_TABLET | Freq: Once | ORAL | Status: AC

## 2022-11-27 MED ORDER — SCOPOLAMINE 1 MG/3DAYS TD PT72
1.0000 | MEDICATED_PATCH | TRANSDERMAL | Status: DC
  Administered 2022-11-27: 1 via TRANSDERMAL

## 2022-11-27 MED ORDER — ACETAMINOPHEN 325 MG PO TABS
650.0000 mg | ORAL_TABLET | Freq: Four times a day (QID) | ORAL | Status: DC | PRN
  Administered 2022-11-27 – 2022-11-28 (×2): 650 mg via ORAL
  Filled 2022-11-27 (×2): qty 2

## 2022-11-27 MED ORDER — OXYCODONE HCL 5 MG PO TABS
ORAL_TABLET | ORAL | Status: AC
  Filled 2022-11-27: qty 1

## 2022-11-27 MED ORDER — MONTELUKAST SODIUM 10 MG PO TABS
10.0000 mg | ORAL_TABLET | Freq: Every day | ORAL | Status: DC
  Administered 2022-11-27: 10 mg via ORAL
  Filled 2022-11-27: qty 1

## 2022-11-27 MED ORDER — ONDANSETRON HCL 4 MG/2ML IJ SOLN
INTRAMUSCULAR | Status: DC | PRN
  Administered 2022-11-27: 4 mg via INTRAVENOUS

## 2022-11-27 MED ORDER — CEFAZOLIN SODIUM-DEXTROSE 2-4 GM/100ML-% IV SOLN
INTRAVENOUS | Status: AC
  Filled 2022-11-27: qty 100

## 2022-11-27 MED ORDER — CHLORHEXIDINE GLUCONATE CLOTH 2 % EX PADS
6.0000 | MEDICATED_PAD | Freq: Once | CUTANEOUS | Status: AC
  Administered 2022-11-27: 6 via TOPICAL

## 2022-11-27 MED ORDER — ONDANSETRON HCL 4 MG/2ML IJ SOLN
INTRAMUSCULAR | Status: AC
  Administered 2022-11-27: 4 mg via INTRAVENOUS
  Filled 2022-11-27: qty 2

## 2022-11-27 MED ORDER — AMISULPRIDE (ANTIEMETIC) 5 MG/2ML IV SOLN: 10.0000 mg | Freq: Once | INTRAVENOUS | Status: DC | PRN

## 2022-11-27 MED ORDER — DEXMEDETOMIDINE HCL IN NACL 80 MCG/20ML IV SOLN
INTRAVENOUS | Status: AC
  Filled 2022-11-27: qty 20

## 2022-11-27 MED ORDER — ROCURONIUM BROMIDE 10 MG/ML (PF) SYRINGE
PREFILLED_SYRINGE | INTRAVENOUS | Status: DC | PRN
  Administered 2022-11-27: 20 mg via INTRAVENOUS
  Administered 2022-11-27: 70 mg via INTRAVENOUS

## 2022-11-27 MED ORDER — BISOPROLOL FUMARATE 5 MG PO TABS
2.5000 mg | ORAL_TABLET | Freq: Every day | ORAL | Status: DC
  Administered 2022-11-28: 2.5 mg via ORAL
  Filled 2022-11-27: qty 1

## 2022-11-27 MED ORDER — SUGAMMADEX SODIUM 200 MG/2ML IV SOLN
INTRAVENOUS | Status: DC | PRN
  Administered 2022-11-27: 200 mg via INTRAVENOUS

## 2022-11-27 MED ORDER — PROPOFOL 10 MG/ML IV BOLUS
INTRAVENOUS | Status: AC
  Filled 2022-11-27: qty 20

## 2022-11-27 MED ORDER — MIDAZOLAM HCL 2 MG/2ML IJ SOLN
INTRAMUSCULAR | Status: AC
  Filled 2022-11-27: qty 2

## 2022-11-27 MED ORDER — ONDANSETRON HCL 4 MG/2ML IJ SOLN: 4.0000 mg | Freq: Once | INTRAMUSCULAR | Status: AC | PRN

## 2022-11-27 MED ORDER — OXYCODONE HCL 5 MG PO TABS
5.0000 mg | ORAL_TABLET | Freq: Once | ORAL | Status: AC | PRN
  Administered 2022-11-27: 5 mg via ORAL

## 2022-11-27 MED ORDER — DEXAMETHASONE SODIUM PHOSPHATE 10 MG/ML IJ SOLN
INTRAMUSCULAR | Status: AC
  Filled 2022-11-27: qty 1

## 2022-11-27 MED ORDER — CEFAZOLIN SODIUM-DEXTROSE 2-4 GM/100ML-% IV SOLN
2.0000 g | INTRAVENOUS | Status: AC
  Administered 2022-11-27: 2 g via INTRAVENOUS

## 2022-11-27 MED ORDER — OXYCODONE HCL 5 MG PO TABS
5.0000 mg | ORAL_TABLET | ORAL | Status: DC | PRN
  Administered 2022-11-27: 10 mg via ORAL
  Filled 2022-11-27: qty 2

## 2022-11-27 MED ORDER — CALCIUM CARBONATE 1250 (500 CA) MG PO TABS
2.0000 | ORAL_TABLET | Freq: Three times a day (TID) | ORAL | Status: DC
  Administered 2022-11-27 – 2022-11-28 (×3): 2500 mg via ORAL
  Filled 2022-11-27 (×3): qty 2

## 2022-11-27 MED ORDER — ONDANSETRON HCL 4 MG/2ML IJ SOLN
INTRAMUSCULAR | Status: AC
  Filled 2022-11-27: qty 2

## 2022-11-27 MED ORDER — LIDOCAINE HCL (PF) 2 % IJ SOLN
INTRAMUSCULAR | Status: AC
  Filled 2022-11-27: qty 5

## 2022-11-27 MED ORDER — ONDANSETRON 4 MG PO TBDP: 4.0000 mg | ORAL_TABLET | Freq: Four times a day (QID) | ORAL | Status: DC | PRN

## 2022-11-27 MED ORDER — ACETAMINOPHEN 650 MG RE SUPP: 650.0000 mg | Freq: Four times a day (QID) | RECTAL | Status: DC | PRN

## 2022-11-27 MED ORDER — 0.9 % SODIUM CHLORIDE (POUR BTL) OPTIME
TOPICAL | Status: DC | PRN
  Administered 2022-11-27: 1000 mL

## 2022-11-27 MED ORDER — PROPOFOL 500 MG/50ML IV EMUL
INTRAVENOUS | Status: AC
  Filled 2022-11-27: qty 50

## 2022-11-27 SURGICAL SUPPLY — 34 items
ADH SKN CLS APL DERMABOND .7 (GAUZE/BANDAGES/DRESSINGS) ×1
APL PRP STRL LF DISP 70% ISPRP (MISCELLANEOUS) ×1
ATTRACTOMAT 16X20 MAGNETIC DRP (DRAPES) ×1 IMPLANT
BAG COUNTER SPONGE SURGICOUNT (BAG) ×1 IMPLANT
BAG SPNG CNTER NS LX DISP (BAG) ×1
BLADE SURG 15 STRL LF DISP TIS (BLADE) ×1 IMPLANT
BLADE SURG 15 STRL SS (BLADE) ×1
CHLORAPREP W/TINT 26 (MISCELLANEOUS) ×1 IMPLANT
CLIP TI MEDIUM 6 (CLIP) ×2 IMPLANT
CLIP TI WIDE RED SMALL 6 (CLIP) ×2 IMPLANT
COVER SURGICAL LIGHT HANDLE (MISCELLANEOUS) ×1 IMPLANT
DERMABOND ADVANCED .7 DNX12 (GAUZE/BANDAGES/DRESSINGS) ×1 IMPLANT
DRAPE LAPAROTOMY T 98X78 PEDS (DRAPES) ×1 IMPLANT
DRAPE UTILITY XL STRL (DRAPES) ×1 IMPLANT
ELECT PENCIL ROCKER SW 15FT (MISCELLANEOUS) ×1 IMPLANT
ELECT REM PT RETURN 15FT ADLT (MISCELLANEOUS) ×1 IMPLANT
GAUZE 4X4 16PLY ~~LOC~~+RFID DBL (SPONGE) ×1 IMPLANT
GLOVE SURG ORTHO 8.0 STRL STRW (GLOVE) ×1 IMPLANT
GOWN STRL REUS W/ TWL XL LVL3 (GOWN DISPOSABLE) ×2 IMPLANT
GOWN STRL REUS W/TWL XL LVL3 (GOWN DISPOSABLE) ×2
HEMOSTAT SURGICEL 2X4 FIBR (HEMOSTASIS) ×1 IMPLANT
ILLUMINATOR WAVEGUIDE N/F (MISCELLANEOUS) ×1 IMPLANT
KIT BASIN OR (CUSTOM PROCEDURE TRAY) ×1 IMPLANT
KIT TURNOVER KIT A (KITS) IMPLANT
PACK BASIC VI WITH GOWN DISP (CUSTOM PROCEDURE TRAY) ×1 IMPLANT
SHEARS HARMONIC 9CM CVD (BLADE) ×1 IMPLANT
SUT MNCRL AB 4-0 PS2 18 (SUTURE) ×1 IMPLANT
SUT SILK 3 0 (SUTURE) ×1
SUT SILK 3-0 18XBRD TIE 12 (SUTURE) IMPLANT
SUT VIC AB 3-0 SH 18 (SUTURE) ×2 IMPLANT
SYR BULB IRRIG 60ML STRL (SYRINGE) ×1 IMPLANT
TOWEL OR 17X26 10 PK STRL BLUE (TOWEL DISPOSABLE) ×1 IMPLANT
TOWEL OR NON WOVEN STRL DISP B (DISPOSABLE) ×1 IMPLANT
TUBING CONNECTING 10 (TUBING) ×1 IMPLANT

## 2022-11-27 NOTE — TOC CM/SW Note (Signed)
Transition of Care [facility]) Screening Note  Patient Details  Name: [name] Date of Birth: December 30, 1980  Transition of Care [facility] CM/SW Contact:    Sherie Don, LCSW Phone Number: 11/27/2022, 1:18 PM  Transition of Care Department [facility]) has reviewed patient and no TOC needs have been identified at this time. We will continue to monitor patient advancement through interdisciplinary progression rounds. If new patient transition needs arise, please place a TOC consult.

## 2022-11-27 NOTE — Interval H&P Note (Signed)
History and Physical Interval Note:  11/27/2022 6:57 AM  [name]  has presented today for surgery, with the diagnosis of RIGHT THYROID NODULE.  The various methods of treatment have been discussed with the patient and family. After consideration of risks, benefits and other options for treatment, the patient has consented to    Procedure(s): TOTAL THYROIDECTOMY (N/A) as a surgical intervention.    The patient's history has been reviewed, patient examined, no change in status, stable for surgery.  I have reviewed the patient's chart and labs.  Questions were answered to the patient's satisfaction.    Armandina Gemma, [facility] [place] practice Office: [place]

## 2022-11-27 NOTE — Transfer of Care (Signed)
Immediate Anesthesia Transfer of Care Note  Patient: [name]  Procedure(s) Performed: TOTAL THYROIDECTOMY  Patient Location: PACU  Anesthesia Type:General  Level of Consciousness: awake, alert , oriented, and patient cooperative  Airway & Oxygen Therapy: Patient Spontanous Breathing and Patient connected to face mask oxygen  Post-op Assessment: Report given to RN, Post -op Vital signs reviewed and stable, and Patient moving all extremities  Post vital signs: Reviewed and stable  Last Vitals:  Vitals Value Taken Time  BP 112/94 11/27/22 0936  Temp    Pulse 75 11/27/22 0938  Resp 20 11/27/22 0938  SpO2 100 % 11/27/22 0938  Vitals shown include unvalidated device data.  Last Pain:  Vitals:   11/27/22 0553  TempSrc:   PainSc: 0-No pain         Complications: No notable events documented.

## 2022-11-27 NOTE — Anesthesia Preprocedure Evaluation (Signed)
Anesthesia Evaluation  Patient identified by MRN, date of birth, ID band Patient awake    Reviewed: Allergy & Precautions, H&P , NPO status , Patient's Chart, lab work & pertinent test results, reviewed documented beta blocker date and time   History of Anesthesia Complications (+) PONV and history of anesthetic complications  Airway Mallampati: II  TM Distance: >3 FB Neck ROM: Full    Dental no notable dental hx. (+) Teeth Intact, Dental Advisory Given   Pulmonary neg pulmonary ROS   Pulmonary exam normal breath sounds clear to auscultation       Cardiovascular hypertension, Pt. on medications and Pt. on home beta blockers Normal cardiovascular exam Rhythm:Regular Rate:Normal     Neuro/Psych negative neurological ROS  negative psych ROS   GI/Hepatic negative GI ROS, Neg liver ROS,,,  Endo/Other   Hyperthyroidism Methimazole   Renal/GU negative Renal ROS  negative genitourinary   Musculoskeletal negative musculoskeletal ROS (+)    Abdominal   Peds negative pediatric ROS (+)  Hematology negative hematology ROS (+) Hb 13.4, plt 268   Anesthesia Other Findings   Reproductive/Obstetrics negative OB ROS                             Anesthesia Physical Anesthesia Plan  ASA: 2  Anesthesia Plan: General   Post-op Pain Management: Tylenol PO (pre-op)* and Precedex   Induction: Intravenous  PONV Risk Score and Plan: 4 or greater and Ondansetron, Dexamethasone, Midazolam, Treatment may vary due to age or medical condition, Propofol infusion and TIVA  Airway Management Planned: Oral ETT  Additional Equipment: None  Intra-op Plan:   Post-operative Plan: Extubation in OR  Informed Consent: I have reviewed the patients History and Physical, chart, labs and discussed the procedure including the risks, benefits and alternatives for the proposed anesthesia with the patient or authorized  representative who has indicated his/her understanding and acceptance.     Dental advisory given  Plan Discussed with: CRNA  Anesthesia Plan Comments:        Anesthesia Quick Evaluation

## 2022-11-27 NOTE — Op Note (Signed)
Procedure Note  Pre-operative Diagnosis:  hyperthyroidism Berenice Primas' disease), right thyroid nodule  Post-operative Diagnosis:  same  Surgeon:  Armandina Gemma, MD  Assistant:  Malachi Pro, PA-C   Procedure:  Total thyroidectomy  Anesthesia:  General  Estimated Blood Loss:  minimal  Drains: none         Specimen: thyroid to pathology  Indications:  Patient is referred by Dr. [name] for surgical evaluation and management of hyperthyroidism and thyroid nodule. Patient first developed symptoms of hyperthyroidism approximately 1 year ago. This included tremors and palpitations. Patient was recently evaluated by her primary care physician, Dr. [name], and noted to have abnormal thyroid function tests. Patient underwent a nuclear medicine thyroid uptake scan on June 29, 2022. This demonstrated a pattern consistent with hyperthyroidism, Graves' disease, with a cold nodule in the inferior right thyroid lobe. Ultrasound examination of the thyroid was performed on July 10, 2022. This demonstrated a slightly enlarged right thyroid lobe and a normal-sized left thyroid lobe. There appeared to be pseudo nodules as well as a single dominant nodule in the inferior right thyroid lobe measuring 2.5 cm in size. This had shown interval increase in size compared to her prior study. She underwent ultrasound-guided fine-needle aspiration biopsy on August 30, 2022. Cytopathology was consistent with a benign follicular nodule, Bethesda category II. Patient presents today on referral for evaluation for thyroidectomy for treatment of hyperthyroidism from Graves' disease as well as a benign cold thyroid nodule. Patient had discussed treatment with radioactive iodine with Dr. [name] and opted for the surgical approach. Patient is taking a beta-blocker. She has not been taking methimazole because she is concerned about side effects. Patient has had no prior head or neck surgery. She has never been on  thyroid medication. There is a family history of medical thyroid disease on her mother side of the family. There is no family history of thyroid malignancy. 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   Procedure Details: Procedure was done in [place] [facility] at the [facility]. The patient was brought to the operating room and placed in a supine position on the operating room table. Following administration of general anesthesia, the patient was positioned and then prepped and draped in the usual aseptic fashion. After ascertaining that an adequate level of anesthesia had been achieved, a small Kocher incision was made with #15 blade. Dissection was carried through subcutaneous tissues and platysma.Hemostasis was achieved with the electrocautery. Skin flaps were elevated cephalad and caudad from the thyroid notch to the sternal notch. A [place]orner self-retaining retractor was placed for exposure. Strap muscles were incised in the midline and dissection was begun on the left side.  Strap muscles were reflected laterally.  Left thyroid lobe was normal in size without nodules.  The left lobe was gently mobilized with blunt dissection. Superior pole vessels were dissected out and divided individually between small and medium ligaclips with the harmonic scalpel. The thyroid lobe was rolled anteriorly. Branches of the inferior thyroid artery were divided between small ligaclips with the harmonic scalpel. Inferior venous tributaries were divided between ligaclips. Both the superior and inferior parathyroid glands were identified and preserved on their vascular pedicles. The recurrent laryngeal nerve was identified and preserved along its course. The ligament of Gwenlyn Found was released with the electrocautery and the gland was mobilized onto the anterior trachea. Isthmus was mobilized across the midline. There was no significant pyramidal lobe present. Dry pack was placed in the left neck.  The  right  thyroid lobe was gently mobilized with blunt dissection. Right thyroid lobe was mildly enlarged and nodular. Superior pole vessels were dissected out and divided between small and medium ligaclips with the Harmonic scalpel. Superior parathyroid was identified and preserved. Inferior venous tributaries were divided between medium ligaclips with the harmonic scalpel. The right thyroid lobe was rolled anteriorly and the branches of the inferior thyroid artery divided between small ligaclips. The right recurrent laryngeal nerve was identified and preserved along its course. The ligament of Gwenlyn Found was released with the electrocautery. The right thyroid lobe was mobilized onto the anterior trachea and the remainder of the thyroid was dissected off the anterior trachea and the thyroid was completely excised. A suture was used to mark the right lobe. The entire thyroid gland was submitted to pathology for review.  Palpation of the operative field demonstrated no evidence of residual disease and no abnormal lymph nodes.  The neck was irrigated with warm saline. Fibrillar was placed throughout the operative field. Strap muscles were approximated in the midline with interrupted 3-0 Vicryl sutures. Platysma was closed with interrupted 3-0 Vicryl sutures. Skin was closed with a running 4-0 Monocryl subcuticular suture. Wound was washed and Dermabond was applied. The patient was awakened from anesthesia and brought to the recovery room. The patient tolerated the procedure well.   Armandina Gemma, [place] Office: [phone]

## 2022-11-27 NOTE — Anesthesia Procedure Notes (Addendum)
Procedure Name: Intubation Date/Time: 11/27/2022 7:22 AM  Performed by: Victoriano Lain, CRNAPre-anesthesia Checklist: Patient identified, Emergency Drugs available, Suction available, Patient being monitored and Timeout performed Patient Re-evaluated:Patient Re-evaluated prior to induction Oxygen Delivery Method: Circle system utilized Preoxygenation: Pre-oxygenation with 100% oxygen Induction Type: IV induction Ventilation: Mask ventilation without difficulty Laryngoscope Size: Mac and 4 Grade View: Grade I Tube type: Oral Tube size: 7.5 mm Number of attempts: 2 Airway Equipment and Method: Stylet Placement Confirmation: ETT inserted through vocal cords under direct vision, positive ETCO2 and breath sounds checked- equal and bilateral Secured at: 21 cm Tube secured with: Tape Dental Injury: Teeth and Oropharynx as per pre-operative assessment  Comments: Smooth IV induction. Easy mask. DL X 1 by CRNA. Grade 1 view with MAC 4. Unable to pass ETT. VSS. DL X 1 by DR Doroteo Glassman. Grade 1 view with MAC 4 blade. ETT passed. ATOI. + ETCO2. BBS=

## 2022-11-27 NOTE — Anesthesia Postprocedure Evaluation (Signed)
Anesthesia Post Note  Patient: [name]  Procedure(s) Performed: TOTAL THYROIDECTOMY     Patient location during evaluation: PACU Anesthesia Type: General Level of consciousness: awake and alert, oriented and patient cooperative Pain management: pain level controlled Vital Signs Assessment: post-procedure vital signs reviewed and stable Respiratory status: spontaneous breathing, nonlabored ventilation and respiratory function stable Cardiovascular status: blood pressure returned to baseline and stable Postop Assessment: no apparent nausea or vomiting Anesthetic complications: no   No notable events documented.  Last Vitals:  Vitals:   11/27/22 1000 11/27/22 1015  BP: (!) 131/95 (!) 135/99  Pulse: 66 (!) 59  Resp: 17 15  Temp:  36.4 C  SpO2: 100% 100%    Last Pain:  Vitals:   11/27/22 1015  TempSrc:   PainSc: [name]

## 2022-11-28 ENCOUNTER — Encounter (HOSPITAL_COMMUNITY): Payer: Self-pay | Admitting: Surgery

## 2022-11-28 ENCOUNTER — Other Ambulatory Visit (HOSPITAL_BASED_OUTPATIENT_CLINIC_OR_DEPARTMENT_OTHER): Payer: Self-pay

## 2022-11-28 DIAGNOSIS — E0500 Thyrotoxicosis with diffuse goiter without thyrotoxic crisis or storm: Secondary | ICD-10-CM | POA: Diagnosis not present

## 2022-11-28 DIAGNOSIS — I10 Essential (primary) hypertension: Secondary | ICD-10-CM | POA: Diagnosis not present

## 2022-11-28 DIAGNOSIS — Z8349 Family history of other endocrine, nutritional and metabolic diseases: Secondary | ICD-10-CM | POA: Diagnosis not present

## 2022-11-28 DIAGNOSIS — D34 Benign neoplasm of thyroid gland: Secondary | ICD-10-CM | POA: Diagnosis not present

## 2022-11-28 DIAGNOSIS — Z79899 Other long term (current) drug therapy: Secondary | ICD-10-CM | POA: Diagnosis not present

## 2022-11-28 DIAGNOSIS — E063 Autoimmune thyroiditis: Secondary | ICD-10-CM | POA: Diagnosis not present

## 2022-11-28 LAB — SURGICAL PATHOLOGY

## 2022-11-28 LAB — CALCIUM: Calcium: 8.9 mg/dL (ref 8.9–10.3)

## 2022-11-28 MED ORDER — PX ANTACID MAXIMUM STRENGTH 1000 MG PO CHEW
1000.0000 mg | CHEWABLE_TABLET | Freq: Three times a day (TID) | ORAL | 1 refills | Status: AC
  Filled 2022-11-28: qty 90, 30d supply, fill #0

## 2022-11-28 MED ORDER — LEVOTHYROXINE SODIUM 88 MCG PO TABS: 88.0000 ug | ORAL_TABLET | Freq: Every day | ORAL | 3 refills | Status: AC

## 2022-11-28 MED ORDER — CALCIUM CARBONATE ANTACID 500 MG PO CHEW: 2.0000 | CHEWABLE_TABLET | Freq: Two times a day (BID) | ORAL | 1 refills | Status: AC

## 2022-11-28 MED ORDER — LEVOTHYROXINE SODIUM 88 MCG PO TABS
88.0000 ug | ORAL_TABLET | Freq: Every day | ORAL | 3 refills | Status: AC
  Filled 2022-11-28: qty 30, 30d supply, fill #0
  Filled 2022-12-28 (×2): qty 30, 30d supply, fill #1
  Filled 2023-01-29: qty 30, 30d supply, fill #2
  Filled 2023-02-22: qty 30, 30d supply, fill #3

## 2022-11-28 NOTE — Progress Notes (Signed)
Contacted [facility] to have medications sent over to pts preferred pharmacy. [facility] states they will correct that.

## 2022-11-28 NOTE — Discharge Instructions (Signed)
[facility] - Dr. Jex Strausbaugh  THYROID & PARATHYROID SURGERY:  POST-OP INSTRUCTIONS  Always review the instruction sheet provided by the hospital nurse at discharge.  A prescription for pain medication may be sent to your pharmacy at the time of discharge.  Take your pain medication as prescribed.  If narcotic pain medicine is not needed, then you may take acetaminophen (Tylenol) or ibuprofen (Advil) as needed for pain or soreness.  Take your normal home medications as prescribed unless otherwise directed.  If you need a refill on your pain medication, please contact the office during regular business hours.  Prescriptions will not be processed by the office after 5:00PM or on weekends.  Start with a light diet upon arrival home, such as soup and crackers or toast.  Be sure to drink plenty of fluids.  Resume your normal diet the day after surgery.  Most patients will experience some swelling and bruising on the chest and neck area.  Ice packs will help for the first 48 hours after arriving home.  Swelling and bruising will take several days to resolve.   It is common to experience some constipation after surgery.  Increasing fluid intake and taking a stool softener (Colace) will usually help to prevent this problem.  A mild laxative (Milk of Magnesia or Miralax) should be taken according to package directions if there has been no bowel movement after 48 hours.  Dermabond glue covers your incision. This seals the wound and you may shower at any time. The Dermabond will remain in place for about a week.  You may gradually remove the glue when it loosens around the edges.  If you need to loosen the Dermabond for removal, apply a layer of Vaseline to the wound for 15 minutes and then remove with a Kleenex. Your sutures are under the skin and will not show - they will dissolve on their own.  You may resume light daily activities beginning the day after discharge (such as self-care,  walking, climbing stairs), gradually increasing activities as tolerated. You may have sexual intercourse when it is comfortable. Refrain from any heavy lifting or straining until approved by your doctor. You may drive when you no longer are taking prescription pain medication, you can comfortably wear a seatbelt, and you can safely maneuver your car and apply the brakes.  You will see your doctor in the office for a follow-up appointment approximately three weeks after your surgery.  Make sure that you call for this appointment within a day or two after you arrive home to insure a convenient appointment time. Please have any requested laboratory tests performed a few days prior to your office visit so that the results will be available at your follow up appointment.  WHEN TO CALL THE [facility] OFFICE: -- Fever greater than 101.5 -- Inability to urinate -- Nausea and/or vomiting - persistent -- Extreme swelling or bruising -- Continued bleeding from incision -- Increased pain, redness, or drainage from the incision -- Difficulty swallowing or breathing -- Muscle cramping or spasms -- Numbness or tingling in hands or around lips  The clinic staff is available to answer your questions during regular business hours.  Please don't hesitate to call and ask to speak to one of the nurses if you have concerns.  [facility] OFFICE: [phone] (24 hours)  Please sign up for MyChart accounts. This will allow you to communicate directly with my nurse or myself without having to call the office. It will also allow you   to view your test results. You will need to enroll in MyChart for my office ([facility]) and for the hospital ([place]).  Luisa Louk, MD [facility] practice 

## 2022-11-28 NOTE — Discharge Summary (Signed)
Physician Discharge Summary   Patient ID: [name] MRN: [id number] DOB/AGE: 03-08-1981 42 y.o.  Admit date: 11/27/2022  Discharge date: 11/28/2022  Discharge Diagnoses:  Principal Problem:   Graves' disease Active Problems:   Thyroid nodule, cold   Graves disease   Discharged Condition: good  Hospital Course: Patient was admitted for observation following thyroid surgery.  Post op course was uncomplicated.  Pain was well controlled.  Tolerated diet.  Post op calcium level on morning following surgery was 8.9 mg/dl.  Patient was prepared for discharge home on POD#1.  Consults: None  Treatments: surgery: total thyroidectomy  Discharge Exam: Blood pressure 126/74, pulse (!) 55, temperature 98.1 F (36.7 C), temperature source Oral, resp. rate 14, height '5\' 9"'$  (1.753 m), weight 75.8 kg, last menstrual period 11/25/2022, SpO2 100 %, not currently breastfeeding. HEENT - clear Neck - wound dry and intact; mild STS; voice normal; Dermabond in place  Disposition: Home  Discharge Instructions     Diet - low sodium heart healthy   Complete by: As directed    Increase activity slowly   Complete by: As directed    No dressing needed   Complete by: As directed       Allergies as of 11/28/2022       Reactions   Amlodipine-olmesartan Hives   Doxycycline Rash        Medication List     TAKE these medications    bisoprolol-hydrochlorothiazide 2.5-6.25 MG tablet Commonly known as: ZIAC TAKE 1 TABLET BY MOUTH ONCE A DAY FOR BLOOD PRESSURE   bisoprolol-hydrochlorothiazide 2.5-6.25 MG tablet Commonly known as: ZIAC Take 1 tablet by mouth once daily for blood pressure   bisoprolol-hydrochlorothiazide 2.5-6.25 MG tablet Commonly known as: ZIAC Take 1 tablet by mouth daily for blood pressure.   calcium carbonate 500 MG chewable tablet Commonly known as: Tums Chew 2 tablets (400 mg of elemental calcium total) by mouth 2 (two) times daily.   Cholecalciferol 25 MCG  (1000 UT) capsule Take 1,000 Units by mouth daily.   levothyroxine 88 MCG tablet Commonly known as: Synthroid Take 1 tablet (88 mcg total) by mouth daily before breakfast.   methocarbamol 500 MG tablet Commonly known as: ROBAXIN Take 1 tablet (500 mg total) by mouth 2 (two) times daily.   montelukast 10 MG tablet Commonly known as: SINGULAIR Tale 1 tablet by mouth daily   multivitamin with minerals Tabs tablet Take 1 tablet by mouth daily.   ondansetron 4 MG disintegrating tablet Commonly known as: ZOFRAN-ODT Dissolve 1 tablet under the tongue every 6 hours as needed for nausea   tretinoin 0.1 % cream Commonly known as: Retin-A Apply topically to affected area once a day.   VITAMIN C PO Take 1 tablet by mouth daily.   Vitamin E/D-Alpha Natural 268 MG (400 UNIT) Caps Generic drug: Vitamin E Take 400 Units by mouth daily.   ZINC PO Take 1 tablet by mouth daily.               Discharge Care Instructions  (From admission, onward)           Start     Ordered   11/28/22 0000  No dressing needed        11/28/22 [id number]            Follow-up Information     Armandina Gemma, MD. Schedule an appointment as soon as possible for a visit in 3 week(s).   Specialty: General Surgery Why: For wound  re-check Contact information: [address] [place] [address] [phone]                 Kyesha Balla, [facility] Office: [phone]   Signed: Armandina Gemma 11/28/2022, 8:55 AM

## 2022-11-29 NOTE — Progress Notes (Signed)
Pathology is benign, as expected.  Armandina Gemma, MD [facility] [place] practice Office: [phone]

## 2022-12-18 DIAGNOSIS — E890 Postprocedural hypothyroidism: Secondary | ICD-10-CM | POA: Diagnosis not present

## 2022-12-18 DIAGNOSIS — E0500 Thyrotoxicosis with diffuse goiter without thyrotoxic crisis or storm: Secondary | ICD-10-CM | POA: Diagnosis not present

## 2022-12-18 DIAGNOSIS — E041 Nontoxic single thyroid nodule: Secondary | ICD-10-CM | POA: Diagnosis not present

## 2022-12-28 ENCOUNTER — Other Ambulatory Visit: Payer: Self-pay

## 2022-12-28 ENCOUNTER — Other Ambulatory Visit (HOSPITAL_BASED_OUTPATIENT_CLINIC_OR_DEPARTMENT_OTHER): Payer: Self-pay

## 2022-12-29 ENCOUNTER — Other Ambulatory Visit: Payer: Self-pay

## 2022-12-29 ENCOUNTER — Other Ambulatory Visit (HOSPITAL_BASED_OUTPATIENT_CLINIC_OR_DEPARTMENT_OTHER): Payer: Self-pay

## 2022-12-29 MED ORDER — BISOPROLOL-HYDROCHLOROTHIAZIDE 2.5-6.25 MG PO TABS
1.0000 | ORAL_TABLET | Freq: Every day | ORAL | 3 refills | Status: AC
  Filled 2022-12-29 – 2023-10-23 (×3): qty 90, 90d supply, fill #0

## 2022-12-29 MED ORDER — BISOPROLOL-HYDROCHLOROTHIAZIDE 2.5-6.25 MG PO TABS
1.0000 | ORAL_TABLET | Freq: Every day | ORAL | 3 refills | Status: AC
  Filled 2022-12-29 – 2023-04-10 (×2): qty 90, 90d supply, fill #0

## 2023-01-10 DIAGNOSIS — E0500 Thyrotoxicosis with diffuse goiter without thyrotoxic crisis or storm: Secondary | ICD-10-CM | POA: Diagnosis not present

## 2023-01-10 DIAGNOSIS — E890 Postprocedural hypothyroidism: Secondary | ICD-10-CM | POA: Diagnosis not present

## 2023-01-12 ENCOUNTER — Other Ambulatory Visit (HOSPITAL_BASED_OUTPATIENT_CLINIC_OR_DEPARTMENT_OTHER): Payer: Self-pay

## 2023-01-29 ENCOUNTER — Other Ambulatory Visit (HOSPITAL_BASED_OUTPATIENT_CLINIC_OR_DEPARTMENT_OTHER): Payer: Self-pay

## 2023-02-22 ENCOUNTER — Other Ambulatory Visit: Payer: Self-pay

## 2023-03-16 DIAGNOSIS — E890 Postprocedural hypothyroidism: Secondary | ICD-10-CM | POA: Diagnosis not present

## 2023-03-16 DIAGNOSIS — E0500 Thyrotoxicosis with diffuse goiter without thyrotoxic crisis or storm: Secondary | ICD-10-CM | POA: Diagnosis not present

## 2023-03-16 DIAGNOSIS — E041 Nontoxic single thyroid nodule: Secondary | ICD-10-CM | POA: Diagnosis not present

## 2023-03-16 DIAGNOSIS — R635 Abnormal weight gain: Secondary | ICD-10-CM | POA: Diagnosis not present

## 2023-03-16 DIAGNOSIS — I10 Essential (primary) hypertension: Secondary | ICD-10-CM | POA: Diagnosis not present

## 2023-03-16 DIAGNOSIS — E559 Vitamin D deficiency, unspecified: Secondary | ICD-10-CM | POA: Diagnosis not present

## 2023-03-19 ENCOUNTER — Other Ambulatory Visit (HOSPITAL_BASED_OUTPATIENT_CLINIC_OR_DEPARTMENT_OTHER): Payer: Self-pay

## 2023-03-19 MED ORDER — LEVOTHYROXINE SODIUM 100 MCG PO TABS
100.0000 ug | ORAL_TABLET | Freq: Every morning | ORAL | 1 refills | Status: DC
  Filled 2023-03-19: qty 30, 30d supply, fill #0
  Filled 2023-04-16: qty 30, 30d supply, fill #1

## 2023-04-10 ENCOUNTER — Other Ambulatory Visit (HOSPITAL_BASED_OUTPATIENT_CLINIC_OR_DEPARTMENT_OTHER): Payer: Self-pay

## 2023-04-16 ENCOUNTER — Other Ambulatory Visit (HOSPITAL_BASED_OUTPATIENT_CLINIC_OR_DEPARTMENT_OTHER): Payer: Self-pay

## 2023-04-16 MED ORDER — BISOPROLOL-HYDROCHLOROTHIAZIDE 2.5-6.25 MG PO TABS
1.0000 | ORAL_TABLET | Freq: Every day | ORAL | 3 refills | Status: AC
  Filled 2023-10-23 – 2024-01-28 (×3): qty 90, 90d supply, fill #0

## 2023-04-16 MED ORDER — BISOPROLOL-HYDROCHLOROTHIAZIDE 2.5-6.25 MG PO TABS
1.0000 | ORAL_TABLET | Freq: Every day | ORAL | 3 refills | Status: AC
  Filled 2023-04-16: qty 90, 90d supply, fill #0

## 2023-04-17 ENCOUNTER — Other Ambulatory Visit (HOSPITAL_BASED_OUTPATIENT_CLINIC_OR_DEPARTMENT_OTHER): Payer: Self-pay

## 2023-05-04 ENCOUNTER — Other Ambulatory Visit (HOSPITAL_BASED_OUTPATIENT_CLINIC_OR_DEPARTMENT_OTHER): Payer: Self-pay

## 2023-05-04 MED ORDER — BISOPROLOL-HYDROCHLOROTHIAZIDE 2.5-6.25 MG PO TABS
1.0000 | ORAL_TABLET | Freq: Every day | ORAL | 3 refills | Status: DC
  Filled 2023-07-16: qty 90, 90d supply, fill #0

## 2023-05-04 MED ORDER — BISOPROLOL-HYDROCHLOROTHIAZIDE 2.5-6.25 MG PO TABS
1.0000 | ORAL_TABLET | Freq: Every day | ORAL | 3 refills | Status: AC | PRN
  Filled 2023-05-04: qty 90, 90d supply, fill #0

## 2023-05-08 ENCOUNTER — Other Ambulatory Visit: Payer: Self-pay | Admitting: Family Medicine

## 2023-05-08 DIAGNOSIS — E890 Postprocedural hypothyroidism: Secondary | ICD-10-CM | POA: Diagnosis not present

## 2023-05-08 DIAGNOSIS — Z1231 Encounter for screening mammogram for malignant neoplasm of breast: Secondary | ICD-10-CM

## 2023-05-09 ENCOUNTER — Ambulatory Visit (INDEPENDENT_AMBULATORY_CARE_PROVIDER_SITE_OTHER): Payer: 59

## 2023-05-09 DIAGNOSIS — Z1231 Encounter for screening mammogram for malignant neoplasm of breast: Secondary | ICD-10-CM | POA: Diagnosis not present

## 2023-05-14 ENCOUNTER — Other Ambulatory Visit (HOSPITAL_BASED_OUTPATIENT_CLINIC_OR_DEPARTMENT_OTHER): Payer: Self-pay

## 2023-05-14 MED ORDER — LEVOTHYROXINE SODIUM 100 MCG PO TABS
100.0000 ug | ORAL_TABLET | Freq: Every morning | ORAL | 1 refills | Status: DC
  Filled 2023-05-14: qty 30, 30d supply, fill #0
  Filled 2023-06-13: qty 30, 30d supply, fill #1

## 2023-05-23 DIAGNOSIS — Z0000 Encounter for general adult medical examination without abnormal findings: Secondary | ICD-10-CM | POA: Diagnosis not present

## 2023-05-23 DIAGNOSIS — R7309 Other abnormal glucose: Secondary | ICD-10-CM | POA: Diagnosis not present

## 2023-05-23 DIAGNOSIS — Z79899 Other long term (current) drug therapy: Secondary | ICD-10-CM | POA: Diagnosis not present

## 2023-05-23 DIAGNOSIS — E890 Postprocedural hypothyroidism: Secondary | ICD-10-CM | POA: Diagnosis not present

## 2023-07-16 ENCOUNTER — Other Ambulatory Visit (HOSPITAL_BASED_OUTPATIENT_CLINIC_OR_DEPARTMENT_OTHER): Payer: Self-pay

## 2023-07-16 MED ORDER — LEVOTHYROXINE SODIUM 100 MCG PO TABS
100.0000 ug | ORAL_TABLET | Freq: Every morning | ORAL | 1 refills | Status: AC
  Filled 2023-10-23: qty 30, 30d supply, fill #0
  Filled 2023-11-29 – 2023-12-03 (×2): qty 30, 30d supply, fill #1

## 2023-07-17 ENCOUNTER — Other Ambulatory Visit (HOSPITAL_BASED_OUTPATIENT_CLINIC_OR_DEPARTMENT_OTHER): Payer: Self-pay

## 2023-07-17 MED ORDER — LEVOTHYROXINE SODIUM 100 MCG PO TABS
100.0000 ug | ORAL_TABLET | Freq: Every morning | ORAL | 1 refills | Status: AC
  Filled 2023-07-17: qty 90, 90d supply, fill #0
  Filled 2024-01-24: qty 90, 90d supply, fill #1

## 2023-10-16 ENCOUNTER — Inpatient Hospital Stay
Admission: RE | Admit: 2023-10-16 | Discharge: 2023-10-16 | Disposition: A | Discharge status: Home / Self Care | Payer: Medicaid Other | Source: Ambulatory Visit | Attending: Family Medicine | Admitting: Family Medicine

## 2023-10-24 ENCOUNTER — Other Ambulatory Visit (HOSPITAL_BASED_OUTPATIENT_CLINIC_OR_DEPARTMENT_OTHER): Payer: Self-pay

## 2023-10-24 ENCOUNTER — Other Ambulatory Visit: Payer: Self-pay

## 2023-10-25 ENCOUNTER — Other Ambulatory Visit (HOSPITAL_BASED_OUTPATIENT_CLINIC_OR_DEPARTMENT_OTHER): Payer: Self-pay

## 2023-12-03 ENCOUNTER — Other Ambulatory Visit (HOSPITAL_BASED_OUTPATIENT_CLINIC_OR_DEPARTMENT_OTHER): Payer: Self-pay

## 2023-12-05 ENCOUNTER — Other Ambulatory Visit (HOSPITAL_COMMUNITY): Payer: Self-pay

## 2023-12-31 ENCOUNTER — Other Ambulatory Visit (HOSPITAL_BASED_OUTPATIENT_CLINIC_OR_DEPARTMENT_OTHER): Payer: Self-pay

## 2023-12-31 MED ORDER — LEVOTHYROXINE SODIUM 100 MCG PO TABS
100.0000 ug | ORAL_TABLET | Freq: Every morning | ORAL | 3 refills | Status: AC
  Filled 2023-12-31: qty 90, 90d supply, fill #0
  Filled 2024-03-27: qty 90, 90d supply, fill #1

## 2024-01-24 ENCOUNTER — Other Ambulatory Visit: Payer: Self-pay

## 2024-01-24 ENCOUNTER — Other Ambulatory Visit (HOSPITAL_COMMUNITY): Payer: Self-pay

## 2024-01-25 ENCOUNTER — Other Ambulatory Visit: Payer: Self-pay

## 2024-01-25 ENCOUNTER — Encounter: Payer: Self-pay | Admitting: Pharmacist

## 2024-01-28 ENCOUNTER — Other Ambulatory Visit (HOSPITAL_BASED_OUTPATIENT_CLINIC_OR_DEPARTMENT_OTHER): Payer: Self-pay

## 2024-01-29 ENCOUNTER — Other Ambulatory Visit: Payer: Self-pay

## 2024-01-29 ENCOUNTER — Other Ambulatory Visit (HOSPITAL_BASED_OUTPATIENT_CLINIC_OR_DEPARTMENT_OTHER): Payer: Self-pay

## 2024-05-13 ENCOUNTER — Other Ambulatory Visit: Payer: Self-pay | Admitting: Family Medicine

## 2024-05-13 DIAGNOSIS — Z1231 Encounter for screening mammogram for malignant neoplasm of breast: Secondary | ICD-10-CM

## 2024-05-21 ENCOUNTER — Encounter

## 2024-05-21 DIAGNOSIS — Z1231 Encounter for screening mammogram for malignant neoplasm of breast: Secondary | ICD-10-CM

## 2024-06-20 ENCOUNTER — Other Ambulatory Visit: Payer: Self-pay | Admitting: Family Medicine

## 2024-06-20 ENCOUNTER — Other Ambulatory Visit (HOSPITAL_BASED_OUTPATIENT_CLINIC_OR_DEPARTMENT_OTHER): Payer: Self-pay

## 2024-06-20 DIAGNOSIS — Z1231 Encounter for screening mammogram for malignant neoplasm of breast: Secondary | ICD-10-CM

## 2024-06-20 MED ORDER — FLUZONE 0.5 ML IM SUSY
0.5000 mL | PREFILLED_SYRINGE | Freq: Once | INTRAMUSCULAR | 0 refills | Status: AC
  Filled 2024-06-20: qty 0.5, 1d supply, fill #0

## 2024-06-24 ENCOUNTER — Ambulatory Visit: Admission: RE | Admit: 2024-06-24 | Discharge: 2024-06-24 | Disposition: A | Source: Ambulatory Visit

## 2024-06-24 DIAGNOSIS — Z1231 Encounter for screening mammogram for malignant neoplasm of breast: Secondary | ICD-10-CM

## 2024-07-02 ENCOUNTER — Other Ambulatory Visit: Payer: Self-pay

## 2024-07-02 ENCOUNTER — Other Ambulatory Visit (HOSPITAL_BASED_OUTPATIENT_CLINIC_OR_DEPARTMENT_OTHER): Payer: Self-pay

## 2024-07-02 MED ORDER — INTEGRA PLUS PO CAPS
1.0000 | ORAL_CAPSULE | Freq: Every day | ORAL | 5 refills | Status: AC
  Filled 2024-07-02: qty 30, 30d supply, fill #0

## 2024-07-02 MED ORDER — LEVOTHYROXINE SODIUM 112 MCG PO TABS
112.0000 ug | ORAL_TABLET | Freq: Every morning | ORAL | 5 refills | Status: AC
  Filled 2024-07-02: qty 30, 30d supply, fill #0
  Filled 2024-07-29: qty 30, 30d supply, fill #1
  Filled 2024-08-28 (×2): qty 30, 30d supply, fill #2

## 2024-07-03 ENCOUNTER — Other Ambulatory Visit (HOSPITAL_BASED_OUTPATIENT_CLINIC_OR_DEPARTMENT_OTHER): Payer: Self-pay

## 2024-07-17 ENCOUNTER — Other Ambulatory Visit (HOSPITAL_BASED_OUTPATIENT_CLINIC_OR_DEPARTMENT_OTHER): Payer: Self-pay

## 2024-07-29 ENCOUNTER — Other Ambulatory Visit (HOSPITAL_BASED_OUTPATIENT_CLINIC_OR_DEPARTMENT_OTHER): Payer: Self-pay

## 2024-07-29 ENCOUNTER — Other Ambulatory Visit: Payer: Self-pay

## 2024-07-30 ENCOUNTER — Other Ambulatory Visit: Payer: Self-pay

## 2024-07-30 ENCOUNTER — Other Ambulatory Visit (HOSPITAL_BASED_OUTPATIENT_CLINIC_OR_DEPARTMENT_OTHER): Payer: Self-pay

## 2024-07-30 MED ORDER — BISOPROLOL-HYDROCHLOROTHIAZIDE 2.5-6.25 MG PO TABS
1.0000 | ORAL_TABLET | Freq: Every day | ORAL | 0 refills | Status: AC
  Filled 2024-07-30: qty 70, 70d supply, fill #0
  Filled 2024-07-30: qty 20, 20d supply, fill #0

## 2024-08-28 ENCOUNTER — Other Ambulatory Visit (HOSPITAL_BASED_OUTPATIENT_CLINIC_OR_DEPARTMENT_OTHER): Payer: Self-pay

## 2024-09-02 ENCOUNTER — Other Ambulatory Visit: Payer: Self-pay

## 2024-09-02 ENCOUNTER — Other Ambulatory Visit (HOSPITAL_BASED_OUTPATIENT_CLINIC_OR_DEPARTMENT_OTHER): Payer: Self-pay

## 2024-09-02 MED ORDER — LEVOTHYROXINE SODIUM 125 MCG PO TABS
125.0000 ug | ORAL_TABLET | Freq: Every day | ORAL | 5 refills | Status: AC
  Filled 2024-09-02: qty 30, 30d supply, fill #0
  Filled 2024-10-06: qty 30, 30d supply, fill #1

## 2024-10-22 ENCOUNTER — Ambulatory Visit: Admitting: Obstetrics and Gynecology

## 2024-10-22 ENCOUNTER — Encounter: Payer: Self-pay | Admitting: Obstetrics and Gynecology

## 2024-10-22 ENCOUNTER — Other Ambulatory Visit (HOSPITAL_COMMUNITY)
Admission: RE | Admit: 2024-10-22 | Discharge: 2024-10-22 | Disposition: A | Source: Ambulatory Visit | Attending: Obstetrics and Gynecology | Admitting: Obstetrics and Gynecology

## 2024-10-22 VITALS — BP 135/81 | HR 60 | Ht 67.0 in | Wt 186.1 lb

## 2024-10-22 DIAGNOSIS — Z124 Encounter for screening for malignant neoplasm of cervix: Secondary | ICD-10-CM | POA: Diagnosis present

## 2024-10-22 DIAGNOSIS — D500 Iron deficiency anemia secondary to blood loss (chronic): Secondary | ICD-10-CM

## 2024-10-22 DIAGNOSIS — N920 Excessive and frequent menstruation with regular cycle: Secondary | ICD-10-CM

## 2024-10-22 DIAGNOSIS — Z113 Encounter for screening for infections with a predominantly sexual mode of transmission: Secondary | ICD-10-CM | POA: Diagnosis not present

## 2024-10-22 NOTE — Progress Notes (Signed)
 "  NEW GYNECOLOGY VISIT Chief Complaint  Patient presents with   Menorrhagia    Heavy bleeding, pap, amenia and extreme pain with periods        Subjective:  [name] is a 44 y.o. [id number] who presents for heavy bleeding.  Has history of very heavy periods for several years. Recently had a hemoglobin 9.5 with her PCP in August. Started oral iron  every other day. Her Hgb improved to 12. She bleeds for 4-5 days heavy 1-2 days light/spotting. Regular in frequency, once a month She has a history of a myomectomy for a small fibroid (less than size of golf ball)   Gyn History: Patient's last menstrual period was 10/22/2024 (exact date). Sexually active: yes/no: Yes Contraception: condoms History of STIs: No Last pap: No results found for: DIAGPAP, HPV, HPVHIGH History of abnormal pap: No Periods: monthly but heavy  OB History     Gravida  3   Para  1   Term  1   Preterm      AB  2   Living  2      SAB  1   IAB  1   Ectopic      Multiple  1   Live Births  1           Past Medical History:  Diagnosis Date   Hypertension    Hyperthyroidism    Infertility, female    PONV (postoperative nausea and vomiting)    Twins    Uterine fibroid     Past Surgical History:  Procedure Laterality Date   CESAREAN SECTION MULTI-GESTATIONAL N/A 09/23/2016   Procedure: Primary CESAREAN SECTION MULTI-GESTATIONAL;  Surgeon: Dickie Carder, MD;  Location: [facility];  Service: Obstetrics;  Laterality: N/A;  EDD: 10/14/16 Allergy: Doxycycline    DIAGNOSTIC LAPAROSCOPY  2014   egg retrieval   FOOT SURGERY Right 1997   achillis reconstruction   LAPAROSCOPIC GELPORT ASSISTED MYOMECTOMY N/A 11/09/2015   Procedure: LAPAROSCOPIC GELPORT ASSISTED MYOMECTOMY; CHROMOTUBATION;  Surgeon: Cynthia Loss, MD;  Location: [facility];  Service: Gynecology;  Laterality: N/A;   LAPAROSCOPY Bilateral 07/08/2014   Procedure: LAPAROSCOPY  OPERATIVE,RIGHT SALPINGECTOMY AND LEFT SALPINGONEOSTOMY ;  Surgeon: Cynthia Loss, MD;  Location: [facility] [place];  Service: Gynecology;  Laterality: Bilateral;   THYROIDECTOMY N/A 11/27/2022   Procedure: TOTAL THYROIDECTOMY;  Surgeon: Eletha Boas, MD;  Location: [facility];  Service: General;  Laterality: N/A;    Social History   Socioeconomic History   Marital status: Legally Separated    Spouse name: Not on file   Number of children: Not on file   Years of education: Not on file   Highest education level: Not on file  Occupational History   Not on file  Tobacco Use   Smoking status: Never   Smokeless tobacco: Never  Vaping Use   Vaping status: Never Used  Substance and Sexual Activity   Alcohol use: No    Comment: RARE   Drug use: No   Sexual activity: Yes    Birth control/protection: None  Other Topics Concern   Not on file  Social History Narrative   Not on file   Social Drivers of Health   Tobacco Use: Low Risk (10/22/2024)   Patient History    Smoking Tobacco Use: Never    Smokeless Tobacco Use: Never    Passive Exposure: Not on file  Financial Resource Strain: Not on file  Food Insecurity: No Food Insecurity (11/27/2022)  Hunger Vital Sign    Worried About Running Out of Food in the Last Year: Never true    Ran Out of Food in the Last Year: Never true  Transportation Needs: No Transportation Needs (11/27/2022)   PRAPARE - Administrator, Civil Service (Medical): No    Lack of Transportation (Non-Medical): No  Physical Activity: Not on file  Stress: Not on file  Social Connections: Not on file  Depression (EYV7-0): Not on file  Alcohol Screen: Not on file  Housing: Low Risk (11/27/2022)   Housing    Last Housing Risk Score: 0  Utilities: Not At Risk (11/27/2022)   AHC Utilities    Threatened with loss of utilities: No  Health Literacy: Not on file    Family History  Problem Relation Age of Onset   Stroke Maternal Grandmother     Diabetes Maternal Grandfather    Hypertension Father    Asthma Mother    Hypertension Mother    Asthma Brother    Cancer Paternal Uncle        lymphoma    Medications Ordered Prior to Encounter[1]  Allergies[2]   Objective:   Vitals:   10/22/24 1507  BP: 135/81  Pulse: 60  Weight: 186 lb 1.9 oz (84.4 kg)  Height: 5' 7 (1.702 m)   Physical Examination:   General appearance - well appearing, and in no distress  Mental status - alert, oriented to person, place, and time  Psych:  normal mood and affect  Skin - warm and dry, normal color, no suspicious lesions noted  Abdomen - soft, nontender, nondistended, no masses or organomegaly  Pelvic -  VULVA: normal appearing vulva with no masses, tenderness or lesions   VAGINA: normal appearing vagina with normal color and discharge, no lesions   CERVIX: normal appearing cervix without discharge or lesions, no CMT  Thin prep pap is done with HR HPV cotesting  UTERUS: uterus is felt to be normal size, shape, consistency and nontender   ADNEXA: No adnexal masses or tenderness noted.  Extremities:  No swelling or varicosities noted  Chaperone present for exam  Assessment and Plan:  1. Menorrhagia with regular cycle (Primary) Hx fibroids. Uterus feels within normal limits on exam. Will get labs and check pelvic ultrasound. Discussed options for management including medical or surgical. She is interested in IUD. Will make sure she is a good candidate on ultrasound first given fibroid history, then can f/u for IUD - CBC - Iron  and TIBC - Ferritin - US  PELVIC COMPLETE WITH TRANSVAGINAL; Future  2. Cervical cancer screening - Cytology - PAP( [place])  3. Iron  deficiency anemia due to chronic blood loss Check labs Continue oral iron  - CBC - Iron  and TIBC - Ferritin  4. Routine screening for STI (sexually transmitted infection) - RPR+HBsAg+HCVAb+... - Cervicovaginal ancillary only( [place])  Rollo ONEIDA Bring, MD,  FACOG Obstetrician & Gynecologist, [facility] for [facility], [facility]     [1]  Current Outpatient Medications on File Prior to Visit  Medication Sig Dispense Refill   Ascorbic Acid (VITAMIN C PO) Take 1 tablet by mouth daily.     bisoprolol -hydrochlorothiazide  (ZIAC ) 2.5-6.25 MG tablet Take 1 tablet by mouth once daily for blood pressure 90 tablet 3   levothyroxine  (SYNTHROID ) 125 MCG tablet Take 1 tablet (125 mcg) by mouth daily in the morning on an empty stomach. 30 tablet 5   Multiple Vitamin (MULTIVITAMIN WITH MINERALS) TABS tablet Take 1  tablet by mouth daily.     bisoprolol -hydrochlorothiazide  (ZIAC ) 2.5-6.25 MG tablet TAKE 1 TABLET BY MOUTH ONCE A DAY FOR BLOOD PRESSURE (Patient not taking: Reported on 11/08/2022) 90 tablet 1   bisoprolol -hydrochlorothiazide  (ZIAC ) 2.5-6.25 MG tablet Take 1 tablet by mouth daily for blood pressure. (Patient not taking: Reported on 10/22/2024) 90 tablet 1   bisoprolol -hydrochlorothiazide  (ZIAC ) 2.5-6.25 MG tablet Take 1 tablet by mouth daily for blood pressure. (Patient not taking: Reported on 10/22/2024) 90 tablet 3   bisoprolol -hydrochlorothiazide  (ZIAC ) 2.5-6.25 MG tablet Take 1 tablet by mouth daily for blood pressure. (Patient not taking: Reported on 10/22/2024) 90 tablet 3   bisoprolol -hydrochlorothiazide  (ZIAC ) 2.5-6.25 MG tablet Take 1 tablet by mouth daily for blood pressure (Patient not taking: Reported on 10/22/2024) 90 tablet 3   bisoprolol -hydrochlorothiazide  (ZIAC ) 2.5-6.25 MG tablet Take 1 tablet by mouth daily for blood pressure (Patient not taking: Reported on 10/22/2024) 90 tablet 3   bisoprolol -hydrochlorothiazide  (ZIAC ) 2.5-6.25 MG tablet Take 1 tablet by mouth daily as needed for blood pressure (Patient not taking: Reported on 10/22/2024) 90 tablet 3   bisoprolol -hydrochlorothiazide  (ZIAC ) 2.5-6.25 MG tablet Take 1 tablet by mouth daily for blood pressure. (Patient not taking: Reported on 10/22/2024) 90  tablet 0   calcium  carbonate (TUMS) 500 MG chewable tablet Chew 2 tablets (400 mg of elemental calcium  total) by mouth 2 (two) times daily. (Patient not taking: Reported on 10/22/2024) 90 tablet 1   calcium  elemental as carbonate (PX ANTACID MAXIMUM STRENGTH) 400 MG chewable tablet Chew 3 tablets (1,200 mg total) by mouth 3 (three) times daily with meals. (Patient not taking: Reported on 10/22/2024) 90 tablet 1   Cholecalciferol 25 MCG (1000 UT) capsule Take 1,000 Units by mouth daily. (Patient not taking: Reported on 10/22/2024)     FeFum-FePoly-FA-B Cmp-C-Biot (INTEGRA PLUS ) CAPS Take 1 capsule by mouth daily. (Patient not taking: Reported on 10/22/2024) 30 capsule 5   levothyroxine  (SYNTHROID ) 100 MCG tablet Take 1 tablet (100 mcg total) by mouth in the morning on an empty stomach (Patient not taking: Reported on 10/22/2024) 30 tablet 1   levothyroxine  (SYNTHROID ) 100 MCG tablet Take 1 tablet (100 mcg total) by mouth in the morning on an empty stomach (Patient not taking: Reported on 10/22/2024) 90 tablet 1   levothyroxine  (SYNTHROID ) 100 MCG tablet Take 1 tablet (100 mcg total) by mouth every morning on an empty stomach. (Patient not taking: Reported on 10/22/2024) 90 tablet 3   levothyroxine  (SYNTHROID ) 112 MCG tablet Take 1 tablet (112 mcg total) by mouth in the morning on an empty stomach . (Patient not taking: Reported on 10/22/2024) 30 tablet 5   levothyroxine  (SYNTHROID ) 88 MCG tablet Take 1 tablet (88 mcg total) by mouth daily before breakfast. (Patient not taking: Reported on 10/22/2024) 30 tablet 3   levothyroxine  (SYNTHROID ) 88 MCG tablet Take 1 tablet (88 mcg total) by mouth daily at least 30-60 minutes before breakfast, on an empty stomach with a glass of water. (Patient not taking: Reported on 10/22/2024) 30 tablet 3   methocarbamol  (ROBAXIN ) 500 MG tablet Take 1 tablet (500 mg total) by mouth 2 (two) times daily. (Patient not taking: Reported on 10/22/2024) 20 tablet 0   montelukast  (SINGULAIR ) 10  MG tablet Tale 1 tablet by mouth daily (Patient not taking: Reported on 10/22/2024) 90 tablet 3   Multiple Vitamins-Minerals (ZINC PO) Take 1 tablet by mouth daily. (Patient not taking: Reported on 10/22/2024)     ondansetron  (ZOFRAN -ODT) 4 MG disintegrating tablet Dissolve 1 tablet under the  tongue every 6 hours as needed for nausea (Patient not taking: Reported on 10/22/2024) 56 tablet 0   tretinoin  (RETIN-A ) 0.1 % cream Apply topically to affected area once a day. (Patient not taking: Reported on 10/22/2024) 20 g 1   Vitamin E (VITAMIN E/D-ALPHA NATURAL) 268 MG (400 UNIT) CAPS Take 400 Units by mouth daily. (Patient not taking: Reported on 10/22/2024)     No current facility-administered medications on file prior to visit.  [2]  Allergies Allergen Reactions   Amlodipine-Olmesartan Hives   Doxycycline  Rash   "

## 2024-10-24 ENCOUNTER — Ambulatory Visit: Payer: Self-pay | Admitting: Obstetrics and Gynecology

## 2024-10-24 DIAGNOSIS — B9689 Other specified bacterial agents as the cause of diseases classified elsewhere: Secondary | ICD-10-CM

## 2024-10-24 LAB — CERVICOVAGINAL ANCILLARY ONLY
Chlamydia: NEGATIVE
Comment: NEGATIVE
Comment: NEGATIVE
Comment: NORMAL
Neisseria Gonorrhea: NEGATIVE
Trichomonas: NEGATIVE

## 2024-10-27 LAB — CYTOLOGY - PAP
Comment: NEGATIVE
Diagnosis: NEGATIVE
Diagnosis: REACTIVE
High risk HPV: NEGATIVE

## 2024-10-29 ENCOUNTER — Other Ambulatory Visit (HOSPITAL_BASED_OUTPATIENT_CLINIC_OR_DEPARTMENT_OTHER): Payer: Self-pay

## 2024-10-29 MED ORDER — METRONIDAZOLE 0.75 % VA GEL
1.0000 | Freq: Every day | VAGINAL | 1 refills | Status: AC
  Filled 2024-10-29: qty 70, 7d supply, fill #0

## 2024-10-30 LAB — IRON AND TIBC
Iron Saturation: 17 % (ref 15–55)
Iron: 54 ug/dL (ref 27–159)
Total Iron Binding Capacity: 310 ug/dL (ref 250–450)
UIBC: 256 ug/dL (ref 131–425)

## 2024-10-30 LAB — CBC
Hematocrit: 35.7 % (ref 34.0–46.6)
Hemoglobin: 11.4 g/dL (ref 11.1–15.9)
MCH: 31.1 pg (ref 26.6–33.0)
MCHC: 31.9 g/dL (ref 31.5–35.7)
MCV: 98 fL — ABNORMAL HIGH (ref 79–97)
Platelets: 266 10*3/uL (ref 150–450)
RBC: 3.66 x10E6/uL — ABNORMAL LOW (ref 3.77–5.28)
RDW: 13 % (ref 11.7–15.4)
WBC: 5.3 10*3/uL (ref 3.4–10.8)

## 2024-10-30 LAB — FERRITIN: Ferritin: 40 ng/mL (ref 15–150)

## 2024-11-01 ENCOUNTER — Ambulatory Visit (HOSPITAL_BASED_OUTPATIENT_CLINIC_OR_DEPARTMENT_OTHER)
